# Patient Record
Sex: Female | Born: 1965 | Hispanic: No | Marital: Single | Smoking: Never smoker
Health system: Southern US, Community
[De-identification: ages and names within clinical notes are randomized; demographics above are authoritative.]

## PROBLEM LIST (undated history)

## (undated) DIAGNOSIS — K219 Gastro-esophageal reflux disease without esophagitis: Secondary | ICD-10-CM

---

## 2016-02-13 ENCOUNTER — Emergency Department (HOSPITAL_COMMUNITY): Payer: BLUE CROSS/BLUE SHIELD

## 2016-02-13 ENCOUNTER — Encounter (HOSPITAL_COMMUNITY): Payer: Self-pay | Admitting: Emergency Medicine

## 2016-02-13 ENCOUNTER — Inpatient Hospital Stay (HOSPITAL_COMMUNITY)
Admission: EM | Admit: 2016-02-13 | Discharge: 2016-02-15 | DRG: 343 | Disposition: A | Discharge status: Home / Self Care | Payer: BLUE CROSS/BLUE SHIELD | Attending: General Surgery | Admitting: General Surgery

## 2016-02-13 DIAGNOSIS — K37 Unspecified appendicitis: Secondary | ICD-10-CM | POA: Diagnosis present

## 2016-02-13 DIAGNOSIS — R1031 Right lower quadrant pain: Secondary | ICD-10-CM | POA: Diagnosis present

## 2016-02-13 DIAGNOSIS — K3580 Unspecified acute appendicitis: Secondary | ICD-10-CM | POA: Diagnosis present

## 2016-02-13 LAB — URINALYSIS, ROUTINE W REFLEX MICROSCOPIC
BILIRUBIN URINE: NEGATIVE
GLUCOSE, UA: NEGATIVE mg/dL
HGB URINE DIPSTICK: NEGATIVE
KETONES UR: NEGATIVE mg/dL
Leukocytes, UA: NEGATIVE
NITRITE: NEGATIVE
PH: 6.5 (ref 5.0–8.0)
Protein, ur: NEGATIVE mg/dL
Specific Gravity, Urine: 1.011 (ref 1.005–1.030)

## 2016-02-13 LAB — COMPREHENSIVE METABOLIC PANEL
ALBUMIN: 3.9 g/dL (ref 3.5–5.0)
ALK PHOS: 105 U/L (ref 38–126)
ALT: 19 U/L (ref 14–54)
ANION GAP: 8 (ref 5–15)
AST: 23 U/L (ref 15–41)
BILIRUBIN TOTAL: 0.6 mg/dL (ref 0.3–1.2)
BUN: 12 mg/dL (ref 6–20)
CALCIUM: 9.2 mg/dL (ref 8.9–10.3)
CO2: 22 mmol/L (ref 22–32)
Chloride: 107 mmol/L (ref 101–111)
Creatinine, Ser: 0.82 mg/dL (ref 0.44–1.00)
GFR calc Af Amer: >60 mL/min (ref >60)
GFR calc non Af Amer: >60 mL/min (ref >60)
GLUCOSE: 96 mg/dL (ref 65–99)
Potassium: 3.4 mmol/L — ABNORMAL LOW (ref 3.5–5.1)
Sodium: 137 mmol/L (ref 135–145)
TOTAL PROTEIN: 7.6 g/dL (ref 6.5–8.1)

## 2016-02-13 LAB — CBC WITH DIFFERENTIAL/PLATELET
BASOS PCT: 0 %
Basophils Absolute: 0 10*3/uL (ref 0.0–0.1)
EOS ABS: 0.5 10*3/uL (ref 0.0–0.7)
EOS PCT: 2 %
HCT: 39.4 % (ref 36.0–46.0)
Hemoglobin: 13.3 g/dL (ref 12.0–15.0)
Lymphocytes Relative: 20 %
Lymphs Abs: 4.6 10*3/uL — ABNORMAL HIGH (ref 0.7–4.0)
MCH: 28.6 pg (ref 26.0–34.0)
MCHC: 33.8 g/dL (ref 30.0–36.0)
MCV: 84.7 fL (ref 78.0–100.0)
MONO ABS: 1 10*3/uL (ref 0.1–1.0)
MONOS PCT: 5 %
Neutro Abs: 16.6 10*3/uL — ABNORMAL HIGH (ref 1.7–7.7)
Neutrophils Relative %: 73 %
Platelets: 405 10*3/uL — ABNORMAL HIGH (ref 150–400)
RBC: 4.65 MIL/uL (ref 3.87–5.11)
RDW: 13.7 % (ref 11.5–15.5)
WBC: 22.7 10*3/uL — ABNORMAL HIGH (ref 4.0–10.5)

## 2016-02-13 LAB — POC URINE PREG, ED: Preg Test, Ur: NEGATIVE

## 2016-02-13 MED ORDER — SODIUM CHLORIDE 0.9 % IV SOLN
INTRAVENOUS | Status: DC
Start: 2016-02-13 — End: 2016-02-14

## 2016-02-13 MED ORDER — CEFTRIAXONE SODIUM 2 G IJ SOLR
2.0000 g | Freq: Once | INTRAMUSCULAR | Status: AC
  Administered 2016-02-13: 2 g via INTRAVENOUS
  Filled 2016-02-13: qty 2

## 2016-02-13 MED ORDER — MORPHINE SULFATE (PF) 2 MG/ML IV SOLN
4.0000 mg | Freq: Once | INTRAVENOUS | Status: AC
  Administered 2016-02-13: 4 mg via INTRAVENOUS
  Filled 2016-02-13: qty 2

## 2016-02-13 MED ORDER — METRONIDAZOLE IN NACL 5-0.79 MG/ML-% IV SOLN: 500.0000 mg | Freq: Three times a day (TID) | INTRAVENOUS | Status: DC

## 2016-02-13 MED ORDER — MORPHINE SULFATE (PF) 2 MG/ML IV SOLN: 2.0000 mg | INTRAVENOUS | Status: DC | PRN

## 2016-02-13 MED ORDER — POTASSIUM CHLORIDE 10 MEQ/100ML IV SOLN: 10.0000 meq | Freq: Once | INTRAVENOUS | Status: DC

## 2016-02-13 MED ORDER — SODIUM CHLORIDE 0.9 % IV SOLN
Freq: Once | INTRAVENOUS | Status: AC
  Administered 2016-02-13: 22:00:00 via INTRAVENOUS

## 2016-02-13 MED ORDER — METRONIDAZOLE IN NACL 5-0.79 MG/ML-% IV SOLN
500.0000 mg | Freq: Once | INTRAVENOUS | Status: AC
  Administered 2016-02-13: 500 mg via INTRAVENOUS
  Filled 2016-02-13: qty 100

## 2016-02-13 MED ORDER — IOPAMIDOL (ISOVUE-300) INJECTION 61%
100.0000 mL | Freq: Once | INTRAVENOUS | Status: AC | PRN
  Administered 2016-02-13: 100 mL via INTRAVENOUS

## 2016-02-13 NOTE — ED Notes (Signed)
Pt requested partial dose of pain meds as she has never taken pain medicine before.  RN will re-evaluate pain and administer the rest of the dose depending on Pt wishes and pain level.

## 2016-02-13 NOTE — ED Provider Notes (Signed)
[facility]-EMERGENCY DEPT Provider Note   CSN: [id number] Arrival date & time: 02/13/16  1727     History   Chief Complaint Chief Complaint  Patient presents with  . Abdominal Pain    HPI Monique Rivers is a 50 y.o. female.  HPI Complains of right lower quadrant abdominal pain, nonradiating onset 10 AM today, gradual in onset. Pain is made worse with moving or laughing or changing positions improved with remaining still. She admits to diminished appetite though she ate lunch today. Her last bowel movement was earlier today, normal. Patient is amenorrheic she denies urinary symptoms. No treatment prior to coming here. She was sitting in an urgent care center prior to coming here and sent here for further evaluation. History reviewed. No pertinent past medical history. Past medical history negative There are no active problems to display for this patient.   History reviewed. No pertinent surgical history.  OB History    No data available       Home Medications    Prior to Admission medications   Not on File    Family History No family history on file.  Social History Social History  Substance Use Topics  . Smoking status: Never Smoker  . Smokeless tobacco: Not on file  . Alcohol use No     Allergies   Review of patient's allergies indicates not on file.   Review of Systems Review of Systems  Constitutional: Positive for appetite change.  HENT: Negative.   Respiratory: Negative.   Cardiovascular: Negative.   Gastrointestinal: Positive for abdominal pain.  Musculoskeletal: Negative.   Skin: Negative.   Neurological: Negative.   Psychiatric/Behavioral: Negative.   All other systems reviewed and are negative.    Physical Exam Updated Vital Signs BP 154/67 (BP Location: Left Arm)   Pulse 90   Temp 98.3 F (36.8 C)   Resp 16   SpO2 100%   Physical Exam  Constitutional: She appears well-developed and well-nourished. No distress.  HENT:  Head:  Normocephalic and atraumatic.  Eyes: Conjunctivae are normal. Pupils are equal, round, and reactive to light.  Neck: Neck supple. No tracheal deviation present. No thyromegaly present.  Cardiovascular: Normal rate and regular rhythm.   No murmur heard. Pulmonary/Chest: Effort normal and breath sounds normal.  Abdominal: Soft. Bowel sounds are normal. She exhibits no distension. There is tenderness.  Tender at right lower quadrant  Musculoskeletal: Normal range of motion. She exhibits no edema or tenderness.  Neurological: She is alert. Coordination normal.  Skin: Skin is warm and dry. No rash noted.  Psychiatric: She has a normal mood and affect.  Nursing note and vitals reviewed.   ED Treatments / Results  Labs (all labs ordered are listed, but only abnormal results are displayed) Labs Reviewed  CBC WITH DIFFERENTIAL/PLATELET - Abnormal; Notable for the following:       Result Value   WBC 22.7 (*)    Platelets 405 (*)    Neutro Abs 16.6 (*)    Lymphs Abs 4.6 (*)    All other components within normal limits  URINALYSIS, ROUTINE W REFLEX MICROSCOPIC (NOT AT [facility])  COMPREHENSIVE METABOLIC PANEL  POC URINE PREG, ED    EKG  EKG Interpretation None     Declines pain medicine  Radiology No results found.  Procedures Procedures (including critical care time)  Medications Ordered in ED Medications - No data to display  Results for orders placed or performed during the hospital encounter of 02/13/16  Comprehensive metabolic panel  Result Value Ref Range   Sodium 137 135 - 145 mmol/L   Potassium 3.4 (L) 3.5 - 5.1 mmol/L   Chloride 107 101 - 111 mmol/L   CO2 22 22 - 32 mmol/L   Glucose, Bld 96 65 - 99 mg/dL   BUN 12 6 - 20 mg/dL   Creatinine, Ser 1.61 0.44 - 1.00 mg/dL   Calcium 9.2 8.9 - 09.6 mg/dL   Total Protein 7.6 6.5 - 8.1 g/dL   Albumin 3.9 3.5 - 5.0 g/dL   AST 23 15 - 41 U/L   ALT 19 14 - 54 U/L   Alkaline Phosphatase 105 38 - 126 U/L   Total Bilirubin 0.6  0.3 - 1.2 mg/dL   GFR calc non Af Amer >60 >60 mL/min   GFR calc Af Amer >60 >60 mL/min   Anion gap 8 5 - 15  Urinalysis, Routine w reflex microscopic (not at [facility])  Result Value Ref Range   Color, Urine YELLOW YELLOW   APPearance CLEAR CLEAR   Specific Gravity, Urine 1.011 1.005 - 1.030   pH 6.5 5.0 - 8.0   Glucose, UA NEGATIVE NEGATIVE mg/dL   Hgb urine dipstick NEGATIVE NEGATIVE   Bilirubin Urine NEGATIVE NEGATIVE   Ketones, ur NEGATIVE NEGATIVE mg/dL   Protein, ur NEGATIVE NEGATIVE mg/dL   Nitrite NEGATIVE NEGATIVE   Leukocytes, UA NEGATIVE NEGATIVE  CBC with Differential/Platelet  Result Value Ref Range   WBC 22.7 (H) 4.0 - 10.5 K/uL   RBC 4.65 3.87 - 5.11 MIL/uL   Hemoglobin 13.3 12.0 - 15.0 g/dL   HCT 04.5 40.9 - 81.1 %   MCV 84.7 78.0 - 100.0 fL   MCH 28.6 26.0 - 34.0 pg   MCHC 33.8 30.0 - 36.0 g/dL   RDW 91.4 78.2 - 95.6 %   Platelets 405 (H) 150 - 400 K/uL   Neutrophils Relative % 73 %   Neutro Abs 16.6 (H) 1.7 - 7.7 K/uL   Lymphocytes Relative 20 %   Lymphs Abs 4.6 (H) 0.7 - 4.0 K/uL   Monocytes Relative 5 %   Monocytes Absolute 1.0 0.1 - 1.0 K/uL   Eosinophils Relative 2 %   Eosinophils Absolute 0.5 0.0 - 0.7 K/uL   Basophils Relative 0 %   Basophils Absolute 0.0 0.0 - 0.1 K/uL  POC Urine Preg, ED (not at [facility])  Result Value Ref Range   Preg Test, Ur NEGATIVE NEGATIVE   Ct Abdomen Pelvis W Contrast  Result Date: 02/13/2016 CLINICAL DATA:  Acute onset of right lower quadrant abdominal pain. Initial encounter. EXAM: CT ABDOMEN AND PELVIS WITH CONTRAST TECHNIQUE: Multidetector CT imaging of the abdomen and pelvis was performed using the standard protocol following bolus administration of intravenous contrast. CONTRAST:  ISOVUE-300 IOPAMIDOL (ISOVUE-300) INJECTION 61% COMPARISON:  None. FINDINGS: Lower chest: Minimal right basilar atelectasis is noted. The visualized lung bases are otherwise clear. The visualized portions of the mediastinum are unremarkable.  Hepatobiliary: The liver is unremarkable in appearance. A stone is noted within the gallbladder. The gallbladder is otherwise unremarkable. The common bile duct remains normal in caliber. Pancreas: The pancreas is within normal limits. Spleen: The spleen is unremarkable in appearance. Adrenals/Urinary Tract: The adrenal glands are unremarkable in appearance. The kidneys are within normal limits. There is no evidence of hydronephrosis. No renal or ureteral stones are identified. No perinephric stranding is seen. Stomach/Bowel: There is dilatation of the appendix to 1.2 cm in maximal diameter, with mild surrounding soft tissue inflammation and trace  fluid. Appendicoliths are noted within the appendix, measuring up to 0.9 cm in size. There is no evidence of perforation or abscess formation at this time. Contrast progresses to the level of the splenic flexure of the colon. The colon is grossly unremarkable in appearance. Visualized small bowel loops are grossly unremarkable. The stomach is unremarkable in appearance. Vascular/Lymphatic: The abdominal aorta is unremarkable in appearance. The inferior vena cava is grossly unremarkable. No retroperitoneal lymphadenopathy is seen. No pelvic sidewall lymphadenopathy is identified. Reproductive: The bladder is mildly distended and within normal limits. The uterus is grossly unremarkable in appearance. The ovaries are relatively symmetric. No suspicious adnexal masses are seen. Other: No additional soft tissue abnormalities are seen. Musculoskeletal: No acute osseous abnormalities are identified. The visualized musculature is unremarkable in appearance. IMPRESSION: 1. Acute appendicitis, with dilatation of the appendix to 1.2 cm in maximal diameter. Mild surrounding soft tissue inflammation and trace fluid. Appendicoliths noted within the appendix, measuring up to 0.9 cm in size. 2. Cholelithiasis.  Gallbladder otherwise unremarkable. These results were called by telephone at  the time of interpretation on 02/13/2016 at 9:54 pm to Dr. Doug SouSAM Kailiana Granquist, who verbally acknowledged these results. Electronically Signed   By: Roanna RaiderJeffery  Chang M.D.   On: 02/13/2016 21:57   Initial Impression / Assessment and Plan / ED Course  I have reviewed the triage vital signs and the nursing notes.  Pertinent labs & imaging results that were available during my care of the patient were reviewed by me and considered in my medical decision making (see chart for details).  Clinical Course   10:10 PM requesting more pain medicine after initial treatment with intravenous morphine. Additional IV morphine ordered Intravenous antibiotics ordered. Surgery service Dr.Toth consulted. He will see patient in the hospital. Patient will be admitted to medical surgical floor   Final Clinical Impressions(s) / ED Diagnoses  Diagnosis #1acute appendicitis #2 hypokalemia Final diagnoses:  None    New Prescriptions New Prescriptions   No medications on file     Doug SouSam Homer Miller, MD 02/13/16 2219

## 2016-02-13 NOTE — ED Triage Notes (Signed)
Per pt, states right lower abdominal pain since 10 am-went to [facility] and was told to come here

## 2016-02-13 NOTE — ED Notes (Signed)
ED Provider at bedside. 

## 2016-02-13 NOTE — H&P (Signed)
Monique Rivers is an 50 y.o. female.   Chief Complaint: abdominal pain HPI:  The patient is a 50 year old white female who began having acute right lower quadrant pain about 10 AM this morning. She has had some nausea associated with it but no vomiting. The pain persisted as the day went on so she went to the emergency department. A CT scan showed an inflamed appendix but no sign of perforation.  History reviewed. No pertinent past medical history.  History reviewed. No pertinent surgical history.  No family history on file. Social History:  reports that she has never smoked. She does not have any smokeless tobacco history on file. She reports that she does not drink alcohol. Her drug history is not on file.  Allergies:  Allergies  Allergen Reactions  . Amoxicillin Nausea And Vomiting    Has patient had a PCN reaction causing immediate rash, facial/tongue/throat swelling, SOB or lightheadedness with hypotension: No Has patient had a PCN reaction causing severe rash involving mucus membranes or skin necrosis: No Has patient had a PCN reaction that required hospitalization: No Has patient had a PCN reaction occurring within the last 10 years: No If all of the above answers are "NO", then may proceed with Cephalosporin use.      (Not in a hospital admission)  Results for orders placed or performed during the hospital encounter of 02/13/16 (from the past 48 hour(s))  Urinalysis, Routine w reflex microscopic (not at [facility])     Status: None   Collection Time: 02/13/16  7:55 PM  Result Value Ref Range   Color, Urine YELLOW YELLOW   APPearance CLEAR CLEAR   Specific Gravity, Urine 1.011 1.005 - 1.030   pH 6.5 5.0 - 8.0   Glucose, UA NEGATIVE NEGATIVE mg/dL   Hgb urine dipstick NEGATIVE NEGATIVE   Bilirubin Urine NEGATIVE NEGATIVE   Ketones, ur NEGATIVE NEGATIVE mg/dL   Protein, ur NEGATIVE NEGATIVE mg/dL   Nitrite NEGATIVE NEGATIVE   Leukocytes, UA NEGATIVE NEGATIVE    Comment:  MICROSCOPIC NOT DONE ON URINES WITH NEGATIVE PROTEIN, BLOOD, LEUKOCYTES, NITRITE, OR GLUCOSE <1000 mg/dL.  Comprehensive metabolic panel     Status: Abnormal   Collection Time: 02/13/16  8:00 PM  Result Value Ref Range   Sodium 137 135 - 145 mmol/L   Potassium 3.4 (L) 3.5 - 5.1 mmol/L   Chloride 107 101 - 111 mmol/L   CO2 22 22 - 32 mmol/L   Glucose, Bld 96 65 - 99 mg/dL   BUN 12 6 - 20 mg/dL   Creatinine, Ser 0.82 0.44 - 1.00 mg/dL   Calcium 9.2 8.9 - 10.3 mg/dL   Total Protein 7.6 6.5 - 8.1 g/dL   Albumin 3.9 3.5 - 5.0 g/dL   AST 23 15 - 41 U/L   ALT 19 14 - 54 U/L   Alkaline Phosphatase 105 38 - 126 U/L   Total Bilirubin 0.6 0.3 - 1.2 mg/dL   GFR calc non Af Amer >60 >60 mL/min   GFR calc Af Amer >60 >60 mL/min    Comment: (NOTE) The eGFR has been calculated using the CKD EPI equation. This calculation has not been validated in all clinical situations. eGFR's persistently <60 mL/min signify possible Chronic Kidney Disease.    Anion gap 8 5 - 15  CBC with Differential/Platelet     Status: Abnormal   Collection Time: 02/13/16  8:00 PM  Result Value Ref Range   WBC 22.7 (H) 4.0 - 10.5 K/uL   RBC  4.65 3.87 - 5.11 MIL/uL   Hemoglobin 13.3 12.0 - 15.0 g/dL   HCT 39.4 36.0 - 46.0 %   MCV 84.7 78.0 - 100.0 fL   MCH 28.6 26.0 - 34.0 pg   MCHC 33.8 30.0 - 36.0 g/dL   RDW 13.7 11.5 - 15.5 %   Platelets 405 (H) 150 - 400 K/uL   Neutrophils Relative % 73 %   Neutro Abs 16.6 (H) 1.7 - 7.7 K/uL   Lymphocytes Relative 20 %   Lymphs Abs 4.6 (H) 0.7 - 4.0 K/uL   Monocytes Relative 5 %   Monocytes Absolute 1.0 0.1 - 1.0 K/uL   Eosinophils Relative 2 %   Eosinophils Absolute 0.5 0.0 - 0.7 K/uL   Basophils Relative 0 %   Basophils Absolute 0.0 0.0 - 0.1 K/uL  POC Urine Preg, ED (not at [facility])     Status: None   Collection Time: 02/13/16  8:04 PM  Result Value Ref Range   Preg Test, Ur NEGATIVE NEGATIVE    Comment:        THE SENSITIVITY OF THIS METHODOLOGY IS >24 mIU/mL    Ct  Abdomen Pelvis W Contrast  Result Date: 02/13/2016 CLINICAL DATA:  Acute onset of right lower quadrant abdominal pain. Initial encounter. EXAM: CT ABDOMEN AND PELVIS WITH CONTRAST TECHNIQUE: Multidetector CT imaging of the abdomen and pelvis was performed using the standard protocol following bolus administration of intravenous contrast. CONTRAST:  146m ISOVUE-300 IOPAMIDOL (ISOVUE-300) INJECTION 61% COMPARISON:  None. FINDINGS: Lower chest: Minimal right basilar atelectasis is noted. The visualized lung bases are otherwise clear. The visualized portions of the mediastinum are unremarkable. Hepatobiliary: The liver is unremarkable in appearance. A stone is noted within the gallbladder. The gallbladder is otherwise unremarkable. The common bile duct remains normal in caliber. Pancreas: The pancreas is within normal limits. Spleen: The spleen is unremarkable in appearance. Adrenals/Urinary Tract: The adrenal glands are unremarkable in appearance. The kidneys are within normal limits. There is no evidence of hydronephrosis. No renal or ureteral stones are identified. No perinephric stranding is seen. Stomach/Bowel: There is dilatation of the appendix to 1.2 cm in maximal diameter, with mild surrounding soft tissue inflammation and trace fluid. Appendicoliths are noted within the appendix, measuring up to 0.9 cm in size. There is no evidence of perforation or abscess formation at this time. Contrast progresses to the level of the splenic flexure of the colon. The colon is grossly unremarkable in appearance. Visualized small bowel loops are grossly unremarkable. The stomach is unremarkable in appearance. Vascular/Lymphatic: The abdominal aorta is unremarkable in appearance. The inferior vena cava is grossly unremarkable. No retroperitoneal lymphadenopathy is seen. No pelvic sidewall lymphadenopathy is identified. Reproductive: The bladder is mildly distended and within normal limits. The uterus is grossly unremarkable  in appearance. The ovaries are relatively symmetric. No suspicious adnexal masses are seen. Other: No additional soft tissue abnormalities are seen. Musculoskeletal: No acute osseous abnormalities are identified. The visualized musculature is unremarkable in appearance. IMPRESSION: 1. Acute appendicitis, with dilatation of the appendix to 1.2 cm in maximal diameter. Mild surrounding soft tissue inflammation and trace fluid. Appendicoliths noted within the appendix, measuring up to 0.9 cm in size. 2. Cholelithiasis.  Gallbladder otherwise unremarkable. These results were called by telephone at the time of interpretation on 02/13/2016 at 9:54 pm to Dr. SOrlie Dakin who verbally acknowledged these results. Electronically Signed   By: JGarald BaldingM.D.   On: 02/13/2016 21:57    Review of Systems  Constitutional:  Negative.   HENT: Negative.   Eyes: Negative.   Respiratory: Negative.   Cardiovascular: Negative.   Gastrointestinal: Positive for abdominal pain and nausea. Negative for vomiting.  Genitourinary: Negative.   Musculoskeletal: Negative.   Skin: Negative.   Neurological: Negative.   Endo/Heme/Allergies: Negative.   Psychiatric/Behavioral: Negative.     Blood pressure 153/72, pulse 94, temperature 98.3 F (36.8 C), resp. rate 20, SpO2 99 %. Physical Exam  Constitutional: She is oriented to person, place, and time. She appears well-developed and well-nourished.  HENT:  Head: Normocephalic and atraumatic.  Eyes: Conjunctivae and EOM are normal. Pupils are equal, round, and reactive to light.  Neck: Normal range of motion. Neck supple.  Cardiovascular: Normal rate, regular rhythm and normal heart sounds.   Respiratory: Effort normal and breath sounds normal.  GI: Soft.  There is moderate focal tenderness in the right lower quadrant  Musculoskeletal: Normal range of motion.  Neurological: She is alert and oriented to person, place, and time.  Skin: Skin is warm and dry.   Psychiatric: She has a normal mood and affect. Her behavior is normal.     Assessment/Plan  The patient appears to have acute appendicitis. Because of the risk of perforation and sepsis likely she would benefit from having her appendix removed. I will plan to admit her and start her on broad-spectrum antibiotics. I will discuss her care with the team in the morning and plan for surgery in the morning.  Merrie Roof, MD 02/13/2016, 11:18 PM

## 2016-02-13 NOTE — Progress Notes (Signed)
Patient listed as having [employer] insurance without a pcp.  EDCM spoke to patient at bedside.  Patient reports her pcp is [facility]ilford [facility]ynne [facility].  System updated.

## 2016-02-14 ENCOUNTER — Encounter (HOSPITAL_COMMUNITY): Admission: EM | Disposition: A | Discharge status: Home / Self Care | Payer: Self-pay | Source: Home / Self Care

## 2016-02-14 ENCOUNTER — Inpatient Hospital Stay (HOSPITAL_COMMUNITY): Payer: BLUE CROSS/BLUE SHIELD | Admitting: Certified Registered Nurse Anesthetist

## 2016-02-14 ENCOUNTER — Encounter (HOSPITAL_COMMUNITY): Payer: Self-pay | Admitting: General Surgery

## 2016-02-14 DIAGNOSIS — K37 Unspecified appendicitis: Secondary | ICD-10-CM | POA: Diagnosis present

## 2016-02-14 HISTORY — PX: LAPAROSCOPIC APPENDECTOMY: SHX408

## 2016-02-14 LAB — SURGICAL PCR SCREEN
MRSA, PCR: NEGATIVE
Staphylococcus aureus: POSITIVE — AB

## 2016-02-14 SURGERY — APPENDECTOMY, LAPAROSCOPIC
Anesthesia: General | Site: Abdomen

## 2016-02-14 MED ORDER — MORPHINE SULFATE (PF) 10 MG/ML IV SOLN: 2.0000 mg | INTRAVENOUS | Status: DC | PRN

## 2016-02-14 MED ORDER — GLYCOPYRROLATE 0.2 MG/ML IV SOSY
PREFILLED_SYRINGE | INTRAVENOUS | Status: DC | PRN
  Administered 2016-02-14: 0.8 mg via INTRAVENOUS

## 2016-02-14 MED ORDER — LIDOCAINE HCL (CARDIAC) 20 MG/ML IV SOLN
INTRAVENOUS | Status: DC | PRN
  Administered 2016-02-14: 100 mg via INTRAVENOUS

## 2016-02-14 MED ORDER — DEXAMETHASONE SODIUM PHOSPHATE 10 MG/ML IJ SOLN
INTRAMUSCULAR | Status: DC | PRN
  Administered 2016-02-14: 10 mg via INTRAVENOUS

## 2016-02-14 MED ORDER — ONDANSETRON HCL 4 MG/2ML IJ SOLN
4.0000 mg | Freq: Four times a day (QID) | INTRAMUSCULAR | Status: DC | PRN
  Administered 2016-02-14: 4 mg via INTRAVENOUS
  Filled 2016-02-14: qty 2

## 2016-02-14 MED ORDER — HYDROMORPHONE HCL 1 MG/ML IJ SOLN: 0.2500 mg | INTRAMUSCULAR | Status: DC | PRN

## 2016-02-14 MED ORDER — SCOPOLAMINE 1 MG/3DAYS TD PT72
MEDICATED_PATCH | TRANSDERMAL | Status: DC | PRN
  Administered 2016-02-14: 1 mg via TRANSDERMAL

## 2016-02-14 MED ORDER — LACTATED RINGERS IR SOLN
Status: DC | PRN
  Administered 2016-02-14: 1000 mL

## 2016-02-14 MED ORDER — DIPHENHYDRAMINE HCL 25 MG PO CAPS
25.0000 mg | ORAL_CAPSULE | Freq: Every evening | ORAL | Status: DC | PRN
  Administered 2016-02-14: 25 mg via ORAL
  Filled 2016-02-14: qty 1

## 2016-02-14 MED ORDER — GLYCOPYRROLATE 0.2 MG/ML IV SOSY
PREFILLED_SYRINGE | INTRAVENOUS | Status: AC
  Filled 2016-02-14: qty 9

## 2016-02-14 MED ORDER — ONDANSETRON HCL 4 MG/2ML IJ SOLN
INTRAMUSCULAR | Status: AC
  Filled 2016-02-14: qty 2

## 2016-02-14 MED ORDER — CHLORHEXIDINE GLUCONATE CLOTH 2 % EX PADS: 6.0000 | MEDICATED_PAD | Freq: Every day | CUTANEOUS | Status: DC

## 2016-02-14 MED ORDER — LIDOCAINE 2% (20 MG/ML) 5 ML SYRINGE
INTRAMUSCULAR | Status: AC
  Filled 2016-02-14: qty 5

## 2016-02-14 MED ORDER — SUCCINYLCHOLINE CHLORIDE 20 MG/ML IJ SOLN
INTRAMUSCULAR | Status: DC | PRN
  Administered 2016-02-14: 100 mg via INTRAVENOUS

## 2016-02-14 MED ORDER — HYDROCODONE-ACETAMINOPHEN 5-325 MG PO TABS
1.0000 | ORAL_TABLET | ORAL | Status: DC | PRN
  Administered 2016-02-15: 1 via ORAL
  Filled 2016-02-14: qty 1

## 2016-02-14 MED ORDER — CIPROFLOXACIN IN D5W 400 MG/200ML IV SOLN
400.0000 mg | Freq: Two times a day (BID) | INTRAVENOUS | Status: DC
  Administered 2016-02-14 – 2016-02-15 (×3): 400 mg via INTRAVENOUS
  Filled 2016-02-14 (×3): qty 200

## 2016-02-14 MED ORDER — KCL IN DEXTROSE-NACL 20-5-0.9 MEQ/L-%-% IV SOLN
INTRAVENOUS | Status: DC
  Administered 2016-02-14 (×2): via INTRAVENOUS
  Administered 2016-02-15: 100 mL/h via INTRAVENOUS
  Filled 2016-02-14 (×5): qty 1000

## 2016-02-14 MED ORDER — DIPHENHYDRAMINE HCL 50 MG/ML IJ SOLN
25.0000 mg | Freq: Four times a day (QID) | INTRAMUSCULAR | Status: DC | PRN
  Administered 2016-02-14: 25 mg via INTRAVENOUS
  Filled 2016-02-14: qty 1

## 2016-02-14 MED ORDER — MORPHINE SULFATE (PF) 2 MG/ML IV SOLN
1.0000 mg | INTRAVENOUS | Status: DC | PRN
  Administered 2016-02-14 (×2): 2 mg via INTRAVENOUS
  Filled 2016-02-14 (×2): qty 1

## 2016-02-14 MED ORDER — METRONIDAZOLE IN NACL 5-0.79 MG/ML-% IV SOLN
500.0000 mg | Freq: Three times a day (TID) | INTRAVENOUS | Status: DC
  Administered 2016-02-14 – 2016-02-15 (×5): 500 mg via INTRAVENOUS
  Filled 2016-02-14 (×5): qty 100

## 2016-02-14 MED ORDER — FAMOTIDINE IN NACL 20-0.9 MG/50ML-% IV SOLN
20.0000 mg | Freq: Two times a day (BID) | INTRAVENOUS | Status: DC
  Administered 2016-02-14 (×2): 20 mg via INTRAVENOUS
  Filled 2016-02-14 (×3): qty 50

## 2016-02-14 MED ORDER — MUPIROCIN 2 % EX OINT
1.0000 "application " | TOPICAL_OINTMENT | Freq: Two times a day (BID) | CUTANEOUS | Status: DC
  Administered 2016-02-14 (×2): 1 via NASAL
  Filled 2016-02-14 (×2): qty 22

## 2016-02-14 MED ORDER — FENTANYL CITRATE (PF) 100 MCG/2ML IJ SOLN
INTRAMUSCULAR | Status: AC
Start: 2016-02-14 — End: 2016-02-14
  Filled 2016-02-14: qty 4

## 2016-02-14 MED ORDER — NEOSTIGMINE METHYLSULFATE 5 MG/5ML IV SOSY
PREFILLED_SYRINGE | INTRAVENOUS | Status: DC | PRN
  Administered 2016-02-14: 5 mg via INTRAVENOUS

## 2016-02-14 MED ORDER — BUPIVACAINE HCL (PF) 0.5 % IJ SOLN
INTRAMUSCULAR | Status: AC
  Filled 2016-02-14: qty 30

## 2016-02-14 MED ORDER — BUPIVACAINE HCL (PF) 0.5 % IJ SOLN
INTRAMUSCULAR | Status: DC | PRN
  Administered 2016-02-14: 10 mL

## 2016-02-14 MED ORDER — FENTANYL CITRATE (PF) 100 MCG/2ML IJ SOLN
INTRAMUSCULAR | Status: AC
  Filled 2016-02-14: qty 2

## 2016-02-14 MED ORDER — MEPERIDINE HCL 50 MG/ML IJ SOLN: 6.2500 mg | INTRAMUSCULAR | Status: DC | PRN

## 2016-02-14 MED ORDER — MIDAZOLAM HCL 2 MG/2ML IJ SOLN
INTRAMUSCULAR | Status: AC
  Filled 2016-02-14: qty 2

## 2016-02-14 MED ORDER — 0.9 % SODIUM CHLORIDE (POUR BTL) OPTIME
TOPICAL | Status: DC | PRN
  Administered 2016-02-14: 1000 mL

## 2016-02-14 MED ORDER — SCOPOLAMINE 1 MG/3DAYS TD PT72
MEDICATED_PATCH | TRANSDERMAL | Status: AC
  Filled 2016-02-14: qty 1

## 2016-02-14 MED ORDER — ROCURONIUM BROMIDE 100 MG/10ML IV SOLN
INTRAVENOUS | Status: DC | PRN
  Administered 2016-02-14: 40 mg via INTRAVENOUS

## 2016-02-14 MED ORDER — ONDANSETRON HCL 4 MG/2ML IJ SOLN
INTRAMUSCULAR | Status: DC | PRN
  Administered 2016-02-14: 4 mg via INTRAVENOUS

## 2016-02-14 MED ORDER — MIDAZOLAM HCL 5 MG/5ML IJ SOLN
INTRAMUSCULAR | Status: DC | PRN
  Administered 2016-02-14: 2 mg via INTRAVENOUS

## 2016-02-14 MED ORDER — ONDANSETRON 4 MG PO TBDP: 4.0000 mg | ORAL_TABLET | Freq: Four times a day (QID) | ORAL | Status: DC | PRN

## 2016-02-14 MED ORDER — PROMETHAZINE HCL 25 MG/ML IJ SOLN: 6.2500 mg | INTRAMUSCULAR | Status: DC | PRN

## 2016-02-14 MED ORDER — ACETAMINOPHEN 325 MG PO TABS
650.0000 mg | ORAL_TABLET | Freq: Four times a day (QID) | ORAL | Status: DC | PRN
  Administered 2016-02-14: 650 mg via ORAL
  Filled 2016-02-14: qty 2

## 2016-02-14 MED ORDER — FENTANYL CITRATE (PF) 100 MCG/2ML IJ SOLN
INTRAMUSCULAR | Status: DC | PRN
  Administered 2016-02-14 (×3): 50 ug via INTRAVENOUS
  Administered 2016-02-14: 100 ug via INTRAVENOUS

## 2016-02-14 MED ORDER — LACTATED RINGERS IV SOLN
INTRAVENOUS | Status: DC
  Administered 2016-02-14 (×3): via INTRAVENOUS

## 2016-02-14 MED ORDER — DEXAMETHASONE SODIUM PHOSPHATE 10 MG/ML IJ SOLN
INTRAMUSCULAR | Status: AC
  Filled 2016-02-14: qty 1

## 2016-02-14 MED ORDER — PROPOFOL 10 MG/ML IV BOLUS
INTRAVENOUS | Status: AC
  Filled 2016-02-14: qty 20

## 2016-02-14 MED ORDER — PROPOFOL 10 MG/ML IV BOLUS
INTRAVENOUS | Status: DC | PRN
  Administered 2016-02-14: 150 mg via INTRAVENOUS

## 2016-02-14 SURGICAL SUPPLY — 52 items
APL SKNCLS STERI-STRIP NONHPOA (GAUZE/BANDAGES/DRESSINGS) ×1
APPLIER CLIP 5 13 M/L LIGAMAX5 (MISCELLANEOUS)
APPLIER CLIP ROT 10 11.4 M/L (STAPLE) ×3
APR CLP MED LRG 11.4X10 (STAPLE) ×1
APR CLP MED LRG 5 ANG JAW (MISCELLANEOUS)
BAG SPEC RTRVL LRG 6X4 10 (ENDOMECHANICALS) ×1
BENZOIN TINCTURE PRP APPL 2/3 (GAUZE/BANDAGES/DRESSINGS) ×3 IMPLANT
CABLE HIGH FREQUENCY MONO STRZ (ELECTRODE) ×3 IMPLANT
CHLORAPREP W/TINT 26ML (MISCELLANEOUS) ×3 IMPLANT
CLIP APPLIE 5 13 M/L LIGAMAX5 (MISCELLANEOUS) IMPLANT
CLIP APPLIE ROT 10 11.4 M/L (STAPLE) IMPLANT
CLOSURE WOUND 1/2 X4 (GAUZE/BANDAGES/DRESSINGS) ×1
COVER SURGICAL LIGHT HANDLE (MISCELLANEOUS) ×3 IMPLANT
CUTTER FLEX LINEAR 45M (STAPLE) IMPLANT
DECANTER SPIKE VIAL GLASS SM (MISCELLANEOUS) ×3 IMPLANT
DRAIN CHANNEL 19F RND (DRAIN) IMPLANT
DRAPE LAPAROSCOPIC ABDOMINAL (DRAPES) ×3 IMPLANT
DRSG TEGADERM 2-3/8X2-3/4 SM (GAUZE/BANDAGES/DRESSINGS) ×5 IMPLANT
ELECT REM PT RETURN 9FT ADLT (ELECTROSURGICAL) ×3
ELECTRODE REM PT RTRN 9FT ADLT (ELECTROSURGICAL) ×1 IMPLANT
ENDOLOOP SUT PDS II  0 18 (SUTURE)
ENDOLOOP SUT PDS II 0 18 (SUTURE) IMPLANT
EVACUATOR SILICONE 100CC (DRAIN) IMPLANT
GAUZE SPONGE 2X2 8PLY STRL LF (GAUZE/BANDAGES/DRESSINGS) ×1 IMPLANT
GLOVE BIOGEL PI IND STRL 6.5 (GLOVE) IMPLANT
GLOVE BIOGEL PI INDICATOR 6.5 (GLOVE) ×8
GLOVE ECLIPSE 8.0 STRL XLNG CF (GLOVE) ×3 IMPLANT
GLOVE INDICATOR 8.0 STRL GRN (GLOVE) ×3 IMPLANT
GOWN STRL REUS W/TWL XL LVL3 (GOWN DISPOSABLE) ×8 IMPLANT
IRRIG SUCT STRYKERFLOW 2 WTIP (MISCELLANEOUS) ×3
IRRIGATION SUCT STRKRFLW 2 WTP (MISCELLANEOUS) ×1 IMPLANT
KIT BASIN OR (CUSTOM PROCEDURE TRAY) ×3 IMPLANT
POUCH SPECIMEN RETRIEVAL 10MM (ENDOMECHANICALS) ×3 IMPLANT
RELOAD 45 VASCULAR/THIN (ENDOMECHANICALS) IMPLANT
RELOAD STAPLE 45 2.5 WHT GRN (ENDOMECHANICALS) IMPLANT
RELOAD STAPLE 45 3.5 BLU ETS (ENDOMECHANICALS) IMPLANT
RELOAD STAPLE TA45 3.5 REG BLU (ENDOMECHANICALS) ×3 IMPLANT
SCISSORS LAP 5X35 DISP (ENDOMECHANICALS) IMPLANT
SHEARS HARMONIC ACE PLUS 36CM (ENDOMECHANICALS) ×3 IMPLANT
SLEEVE XCEL OPT CAN 5 100 (ENDOMECHANICALS) ×3 IMPLANT
SPONGE GAUZE 2X2 STER 10/PKG (GAUZE/BANDAGES/DRESSINGS) ×2
STRIP CLOSURE SKIN 1/2X4 (GAUZE/BANDAGES/DRESSINGS) ×2 IMPLANT
SUT ETHILON 3 0 PS 1 (SUTURE) IMPLANT
SUT MNCRL AB 4-0 PS2 18 (SUTURE) ×3 IMPLANT
TOWEL OR 17X26 10 PK STRL BLUE (TOWEL DISPOSABLE) ×3 IMPLANT
TOWEL OR NON WOVEN STRL DISP B (DISPOSABLE) ×3 IMPLANT
TRAY FOLEY W/METER SILVER 14FR (SET/KITS/TRAYS/PACK) IMPLANT
TRAY FOLEY W/METER SILVER 16FR (SET/KITS/TRAYS/PACK) IMPLANT
TRAY LAPAROSCOPIC (CUSTOM PROCEDURE TRAY) ×3 IMPLANT
TROCAR BLADELESS OPT 5 100 (ENDOMECHANICALS) ×3 IMPLANT
TROCAR XCEL BLUNT TIP 100MML (ENDOMECHANICALS) ×3 IMPLANT
TUBING INSUF HEATED (TUBING) ×3 IMPLANT

## 2016-02-14 NOTE — Progress Notes (Signed)
Monique Rivers is a 50 y.o. female patient admitted from ED awake, alert - oriented  X 4 - no acute distress noted.  VSS - Blood pressure 136/74, pulse 91, temperature 98.6 F (37 C), temperature source Axillary, resp. rate 20, SpO2 99 %.    IV in place, occlusive dsg intact without redness.  Orientation to room, and floor completed with information packet given to patient/family.  Patient declined safety video at this time.  Admission INP armband ID verified with patient/family, and in place.   SR up x 2, fall assessment complete, with patient and family able to verbalize understanding of risk associated with falls, and verbalized understanding to call nsg before up out of bed.  Call light within reach, patient able to voice, and demonstrate understanding.  Skin, clean-dry- intact without evidence of bruising, or skin tears.   No evidence of skin break down noted on exam.     Will cont to eval and treat per MD orders.  Elissa HeftyPatricia D Ramirez, RN 02/14/2016 12:39 AM

## 2016-02-14 NOTE — Progress Notes (Signed)
She was seen and examined. CT reviewed. Plan laparoscopic possible open appendectomy.I have discussed the procedure and risks of appendectomy. The risks include but are not limited to bleeding, infection, wound problems, anesthesia, injury to intra-abdominal organs, possibility of postoperative ileus. She seems to understand and agrees with the plan.

## 2016-02-14 NOTE — Anesthesia Postprocedure Evaluation (Signed)
Anesthesia Post Note  Patient: Monique Rivers  Procedure(s) Performed: Procedure(s) (LRB): APPENDECTOMY LAPAROSCOPIC (N/A)  Patient location during evaluation: PACU Anesthesia Type: General Level of consciousness: sedated and patient cooperative Pain management: pain level controlled Vital Signs Assessment: post-procedure vital signs reviewed and stable Respiratory status: spontaneous breathing Cardiovascular status: stable Anesthetic complications: no    Last Vitals:  Vitals:   02/14/16 1234 02/14/16 1349  BP: 115/62 120/60  Pulse:  77  Resp: 12 15  Temp: 36.6 C 36.7 C    Last Pain:  Vitals:   02/14/16 1349  TempSrc: Oral  PainSc:                  Monique Rivers

## 2016-02-14 NOTE — Progress Notes (Signed)
Patient with positional IV, requested another IV in left arm. 2 unsuccessful attempts made, patient declined IV team consult.

## 2016-02-14 NOTE — Op Note (Signed)
Appendectomy, Lap, Procedure Note  Pre-operative Diagnosis:  Acute appendicitis  Post-operative Diagnosis: Same  Procedure:  Laparoscopic appendectomy  Surgeon:  Avel Peaceodd Opal Dinning, M.D.  Anesthesia:  General   Indications:  This is a 50 year old female admitted earlier this morning with acute appendicitis.  She is brought to the OR for appendectomy.   Procedure Details   She was brought to the operating room, placed in the supine position and general anesthesia was induced, along with placement of orogastric tube, SCDs, and a Foley catheter. A timeout was performed. The abdomen was prepped and draped in a sterile fashion. A small infraumbilical incision was made through the skin, subcutaneous tissue, fascia, and peritoneum entering the peritoneal cavity under direct vision.  A pursestring suture was passed around the fascia with a 0 Vicryl.  The Hasson was introduced into the peritoneal cavity and the tails of the suture were used to hold the Hasson in place.   The pneumoperitoneum was then established to steady pressure of 15 mmHg.   The laparoscope was introduced and there was no evidence of bleeding or underlying organ injury. Additional 5 mm cannulas then placed in the left lower quadrant of the abdomen and the right upper quadrant region under direct visualization. A careful evaluation of the entire abdomen was carried out. The patient was placed in Trendelenburg and left lateral decubitus position. The small intestines were retracted in the cephalad and left lateral direction away from the pelvis and right lower quadrant. The patient was found to have an enlarged and inflamed appendix that was extending into the right pelvis. There was no evidence of perforation.  The appendix was carefully mobilized. The mesoappendix was was divided with the harmonic scalpel.   The appendix was amputated off the cecum, with a small cuff of cecum, using an endo-GIA stapler.  The appendix was placed in a  retrieval bag and removed through the subumbilical port incision.    There was no evidence of bleeding, leakage, or complication after division of the appendix. Copious irrigation was  performed and irrigant fluid suctioned from the abdomen as much as possible.  The umbilical trocar was removed and the  port site fascia was closed via the purse string suture under laparoscopic vision. There was no residual palpable fascial defect.  The remaining trocars were removed and all  trocar site skin wounds were closed with 4-0 Monocryl.  Instrument, sponge, and needle counts were correct at the conclusion of the case.   Findings: The appendix was found to be inflamed. There were not signs of necrosis.  There was not perforation. There was not abscess formation.  Estimated Blood Loss:  150 ml         Drains: none         Specimens: appendix         Complications:  None; patient tolerated the procedure well.         Disposition: PACU - hemodynamically stable.         Condition: stable

## 2016-02-14 NOTE — Anesthesia Preprocedure Evaluation (Signed)
Anesthesia Evaluation  Patient identified by MRN, date of birth, ID band Patient awake    Reviewed: Allergy & Precautions, NPO status , Patient's Chart, lab work & pertinent test results  Airway Mallampati: II  TM Distance: >3 FB Neck ROM: Full    Dental no notable dental hx.    Pulmonary neg pulmonary ROS,    Pulmonary exam normal breath sounds clear to auscultation       Cardiovascular negative cardio ROS Normal cardiovascular exam Rhythm:Regular Rate:Normal     Neuro/Psych negative neurological ROS  negative psych ROS   GI/Hepatic negative GI ROS, Neg liver ROS,   Endo/Other  negative endocrine ROS  Renal/GU negative Renal ROS  negative genitourinary   Musculoskeletal negative musculoskeletal ROS (+)   Abdominal   Peds negative pediatric ROS (+)  Hematology negative hematology ROS (+)   Anesthesia Other Findings   Reproductive/Obstetrics negative OB ROS                             Anesthesia Physical Anesthesia Plan  ASA: II  Anesthesia Plan: General   Post-op Pain Management:    Induction: Intravenous  Airway Management Planned: Oral ETT  Additional Equipment:   Intra-op Plan:   Post-operative Plan: Extubation in OR  Informed Consent: I have reviewed the patients History and Physical, chart, labs and discussed the procedure including the risks, benefits and alternatives for the proposed anesthesia with the patient or authorized representative who has indicated his/her understanding and acceptance.   Dental advisory given  Plan Discussed with: CRNA  Anesthesia Plan Comments:         Anesthesia Quick Evaluation  

## 2016-02-14 NOTE — Progress Notes (Signed)
Paged Dr. Carolynne Edouardoth about patient BP 163/85. Other VSS. Patient stable.  Received a call from MD, no new orders at this time/ will monitor patient.

## 2016-02-14 NOTE — Transfer of Care (Signed)
Immediate Anesthesia Transfer of Care Note  Patient: Monique Rivers  Procedure(s) Performed: Procedure(s): APPENDECTOMY LAPAROSCOPIC (N/A)  Patient Location: PACU  Anesthesia Type:General  Level of Consciousness:  sedated, patient cooperative and responds to stimulation  Airway & Oxygen Therapy:Patient Spontanous Breathing and Patient connected to face mask oxgen  Post-op Assessment:  Report given to PACU RN and Post -op Vital signs reviewed and stable  Post vital signs:  Reviewed and stable  Last Vitals:  Vitals:   02/14/16 0609 02/14/16 1156  BP: (!) 163/85 (!) 130/113  Pulse: 89 (!) 104  Resp: 20 (P) 16  Temp: 36.8 C (P) 36.8 C    Complications: No apparent anesthesia complications

## 2016-02-14 NOTE — Discharge Instructions (Addendum)
[facility] ______[facility] LAPAROSCOPIC SURGERY: POST OP INSTRUCTIONS Always review your discharge instruction sheet given to you by the facility where your surgery was performed. IF YOU HAVE DISABILITY OR FAMILY LEAVE FORMS, YOU MUST BRING THEM TO THE OFFICE FOR PROCESSING.   DO NOT GIVE THEM TO YOUR DOCTOR.  1. A prescription for pain medication may be given to you upon discharge.  Take your pain medication as prescribed, if needed.  If narcotic pain medicine is not needed, then you may take acetaminophen (Tylenol) or ibuprofen (Advil) as needed. 2. Take your usually prescribed medications unless otherwise directed. 3. If you need a refill on your pain medication, please contact your pharmacy.  They will contact our office to request authorization. Prescriptions will not be filled after 5pm or on week-ends. 4. You should follow a light diet the first few days after arrival home, such as soup and crackers, etc.  Be sure to include lots of fluids daily.  May start lowfat, solid foods 2 days after the surgery. 5. Most patients will experience some swelling and bruising in the area of the incisions.  Ice packs will help.  Swelling and bruising can take several days to resolve.  6. It is common to experience some constipation if taking pain medication after surgery.  Increasing fluid intake and taking a stool softener (such as Colace) will usually help or prevent this problem from occurring.  A mild laxative (Milk of Magnesia or Miralax) should be taken according to package instructions if there are no bowel movements after 48 hours. 7. Unless discharge instructions indicate otherwise, you may remove your bandages 72 hours after surgery.  You may shower the day after surgery.  You may have steri-strips (small skin tapes) in place directly over the incision.  These strips should be left on the skin until they fall off.  If your surgeon used skin glue on the incision, you may shower in 24 hours.   The glue will flake off over the next 2-3 weeks.  Any sutures or staples will be removed at the office during your follow-up visit. 8. ACTIVITIES:  You may resume regular (light) daily activities beginning the next day--such as daily self-care, walking, climbing stairs--gradually increasing activities as tolerated.  You may have sexual intercourse when it is comfortable.  Refrain from any heavy lifting or straining for two weeks.  Do not lift anything over 10 pounds during that time.  a. You may drive when you are no longer taking prescription pain medication, you can comfortably wear a seatbelt, and you can safely maneuver your car and apply brakes. b. RETURN TO WORK:  Desk type work in 5-7 days, full duty work in 2 weeks if you are pain-free.________________________________________________________ 9. You should see your doctor in the office for a follow-up appointment approximately 2-3 weeks after your surgery.  Make sure that you call for this appointment within a day or two after you arrive home to insure a convenient appointment time. 10. OTHER INSTRUCTIONS: __________________________________________________________________________________________________________________________ __________________________________________________________________________________________________________________________ WHEN TO CALL YOUR DOCTOR: 1. Fever over 101.0 2. Inability to urinate 3. Continued bleeding from incision. 4. Increased pain, redness, or drainage from the incision. 5. Increasing abdominal pain  The clinic staff is available to answer your questions during regular business hours.  Please dont hesitate to call and ask to speak to one of the nurses for clinical concerns.  If you have a medical emergency, go to the nearest emergency room or call 911.  A surgeon from [facility] is always on call at the hospital. [address], [address], [place], [place]  [address] ? [address] [address],  [place], [place]   [address] [phone] ? [phone] ? FAX [phone] Web site: [email or web address]

## 2016-02-14 NOTE — Progress Notes (Signed)
Paged Dr. Carolynne Edouardoth for Benadryl d/t morphine causing itching. Received verbal orders for benadryl.

## 2016-02-14 NOTE — Anesthesia Procedure Notes (Signed)
Procedure Name: Intubation Date/Time: 02/14/2016 10:39 AM Performed by: Wynonia SoursWALKER, Makenzi Bannister L Pre-anesthesia Checklist: Patient identified, Emergency Drugs available, Suction available, Patient being monitored and Timeout performed Patient Re-evaluated:Patient Re-evaluated prior to inductionOxygen Delivery Method: Circle system utilized Preoxygenation: Pre-oxygenation with 100% oxygen Intubation Type: IV induction Ventilation: Mask ventilation without difficulty Laryngoscope Size: Mac and 4 Grade View: Grade II Tube type: Oral Tube size: 7.5 mm Number of attempts: 1 Airway Equipment and Method: Stylet Placement Confirmation: ETT inserted through vocal cords under direct vision,  positive ETCO2,  CO2 detector and breath sounds checked- equal and bilateral Secured at: 21 cm Tube secured with: Tape Dental Injury: Teeth and Oropharynx as per pre-operative assessment

## 2016-02-15 MED ORDER — CIPROFLOXACIN HCL 500 MG PO TABS
500.0000 mg | ORAL_TABLET | Freq: Two times a day (BID) | ORAL | 0 refills | Status: DC
Start: 2016-02-15 — End: 2018-02-10

## 2016-02-15 MED ORDER — METRONIDAZOLE 500 MG PO TABS: 500.0000 mg | ORAL_TABLET | Freq: Three times a day (TID) | ORAL | 0 refills | Status: DC

## 2016-02-15 MED ORDER — HYDROCODONE-ACETAMINOPHEN 5-325 MG PO TABS: 1.0000 | ORAL_TABLET | ORAL | 0 refills | Status: DC | PRN

## 2016-02-15 NOTE — Progress Notes (Signed)
Assessment Active Problems:   Acute appendicitis s/p laparoscopic appendectomy 02/14/16-looks good this AM.     Plan:  Discharge today.  Instructions given to her.   LOS: 2 days     1 Day Post-Op  Subjective: A little sore. Tolerating diet.  Sitting up in chair talking on phone.  Objective: Vital signs in last 24 hours: Temp:  [97.9 F (36.6 C)-98.4 F (36.9 C)] 98 F (36.7 C) (11/03 0550) Pulse Rate:  [77-104] 88 (11/03 0550) Resp:  [12-19] 16 (11/03 0550) BP: (99-145)/(47-113) 99/47 (11/03 0550) SpO2:  [93 %-99 %] 98 % (11/03 0550)    Intake/Output from previous day: 11/02 0701 - 11/03 0700 In: 3980 [P.O.:480; I.V.:3200; IV Piggyback:300] Out: 3600 [Urine:3550; Blood:50] Intake/Output this shift: No intake/output data recorded.  PE: General- In NAD Abdomen-dressings dry  Lab Results:   Recent Labs  02/13/16 2000  WBC 22.7*  HGB 13.3  HCT 39.4  PLT 405*   BMET  Recent Labs  02/13/16 2000  NA 137  K 3.4*  CL 107  CO2 22  GLUCOSE 96  BUN 12  CREATININE 0.82  CALCIUM 9.2   PT/INR No results for input(s): LABPROT, INR in the last 72 hours. Comprehensive Metabolic Panel:    Component Value Date/Time   NA 137 02/13/2016 2000   K 3.4 (L) 02/13/2016 2000   CL 107 02/13/2016 2000   CO2 22 02/13/2016 2000   BUN 12 02/13/2016 2000   CREATININE 0.82 02/13/2016 2000   GLUCOSE 96 02/13/2016 2000   CALCIUM 9.2 02/13/2016 2000   AST 23 02/13/2016 2000   ALT 19 02/13/2016 2000   ALKPHOS 105 02/13/2016 2000   BILITOT 0.6 02/13/2016 2000   PROT 7.6 02/13/2016 2000   ALBUMIN 3.9 02/13/2016 2000     Studies/Results: Ct Abdomen Pelvis W Contrast  Result Date: 02/13/2016 CLINICAL DATA:  Acute onset of right lower quadrant abdominal pain. Initial encounter. EXAM: CT ABDOMEN AND PELVIS WITH CONTRAST TECHNIQUE: Multidetector CT imaging of the abdomen and pelvis was performed using the standard protocol following bolus administration of intravenous  contrast. CONTRAST:  100mL ISOVUE-300 IOPAMIDOL (ISOVUE-300) INJECTION 61% COMPARISON:  None. FINDINGS: Lower chest: Minimal right basilar atelectasis is noted. The visualized lung bases are otherwise clear. The visualized portions of the mediastinum are unremarkable. Hepatobiliary: The liver is unremarkable in appearance. A stone is noted within the gallbladder. The gallbladder is otherwise unremarkable. The common bile duct remains normal in caliber. Pancreas: The pancreas is within normal limits. Spleen: The spleen is unremarkable in appearance. Adrenals/Urinary Tract: The adrenal glands are unremarkable in appearance. The kidneys are within normal limits. There is no evidence of hydronephrosis. No renal or ureteral stones are identified. No perinephric stranding is seen. Stomach/Bowel: There is dilatation of the appendix to 1.2 cm in maximal diameter, with mild surrounding soft tissue inflammation and trace fluid. Appendicoliths are noted within the appendix, measuring up to 0.9 cm in size. There is no evidence of perforation or abscess formation at this time. Contrast progresses to the level of the splenic flexure of the colon. The colon is grossly unremarkable in appearance. Visualized small bowel loops are grossly unremarkable. The stomach is unremarkable in appearance. Vascular/Lymphatic: The abdominal aorta is unremarkable in appearance. The inferior vena cava is grossly unremarkable. No retroperitoneal lymphadenopathy is seen. No pelvic sidewall lymphadenopathy is identified. Reproductive: The bladder is mildly distended and within normal limits. The uterus is grossly unremarkable in appearance. The ovaries are relatively symmetric. No suspicious adnexal masses are  seen. Other: No additional soft tissue abnormalities are seen. Musculoskeletal: No acute osseous abnormalities are identified. The visualized musculature is unremarkable in appearance. IMPRESSION: 1. Acute appendicitis, with dilatation of the  appendix to 1.2 cm in maximal diameter. Mild surrounding soft tissue inflammation and trace fluid. Appendicoliths noted within the appendix, measuring up to 0.9 cm in size. 2. Cholelithiasis.  Gallbladder otherwise unremarkable. These results were called by telephone at the time of interpretation on 02/13/2016 at 9:54 pm to Dr. Doug SouSAM JACUBOWITZ, who verbally acknowledged these results. Electronically Signed   By: Roanna RaiderJeffery  Chang M.D.   On: 02/13/2016 21:57    Anti-infectives: Anti-infectives    Start     Dose/Rate Route Frequency Ordered Stop   02/14/16 0015  ciprofloxacin (CIPRO) IVPB 400 mg     400 mg 200 mL/hr over 60 Minutes Intravenous Every 12 hours 02/14/16 0015     02/14/16 0015  metroNIDAZOLE (FLAGYL) IVPB 500 mg     500 mg 100 mL/hr over 60 Minutes Intravenous Every 8 hours 02/14/16 0015     02/13/16 2230  metroNIDAZOLE (FLAGYL) IVPB 500 mg  Status:  Discontinued     500 mg 100 mL/hr over 60 Minutes Intravenous Every 8 hours 02/13/16 2223 02/14/16 0022   02/13/16 2215  cefTRIAXone (ROCEPHIN) 2 g in dextrose 5 % 50 mL IVPB     2 g 100 mL/hr over 30 Minutes Intravenous  Once 02/13/16 2207 02/14/16 0003   02/13/16 2215  metroNIDAZOLE (FLAGYL) IVPB 500 mg     500 mg 100 mL/hr over 60 Minutes Intravenous  Once 02/13/16 2207 02/13/16 2333       Ashtynn Berke J 02/15/2016

## 2016-02-15 NOTE — Progress Notes (Signed)
Discharge instructions and prescriptions given to patients

## 2016-02-25 NOTE — Discharge Summary (Signed)
Physician Discharge Summary  Patient ID: Monique Rivers MRN: [id number] DOB/AGE: October 25, 1965 50 y.o.  Admit date: 02/13/2016 Discharge date: 02/15/2016  Admission Diagnoses:  Acute appendicitis  Discharge Diagnoses:  Active Problems:   Acute appendicitis s/p laparoscopic appendectomy 02/14/2016.      Discharged Condition: good  Hospital Course: She was admitted, started on IV antibiotics, and underwent a laparoscopic appendectomy 02/14/2016. She tolerated this well and was able to be discharged on her first postoperative day. Discharge instructions were given to her.   Discharge Exam: Blood pressure (!) 99/47, pulse 88, temperature 98 F (36.7 C), temperature source Oral, resp. rate 16, SpO2 98 %.   Disposition: 01-Home or Self Care     Medication List    TAKE these medications   aspirin 81 MG chewable tablet Chew 81 mg by mouth daily.   CALCIUM PO Take 1 tablet by mouth daily.   cetirizine 10 MG tablet Commonly known as:  ZYRTEC Take 10 mg by mouth daily.   ciprofloxacin 500 MG tablet Commonly known as:  CIPRO Take 1 tablet (500 mg total) by mouth 2 (two) times daily.   fluticasone 50 MCG/ACT nasal spray Commonly known as:  FLONASE Place 1 spray into both nostrils daily.   HYDROcodone-acetaminophen 5-325 MG tablet Commonly known as:  NORCO/VICODIN Take 1-2 tablets by mouth every 4 (four) hours as needed (severe pain).   LORYNA 3-0.02 MG tablet Generic drug:  drospirenone-ethinyl estradiol Take 1 tablet by mouth daily.   metroNIDAZOLE 500 MG tablet Commonly known as:  FLAGYL Take 1 tablet (500 mg total) by mouth 3 (three) times daily.   multivitamin with minerals Tabs tablet Take 1 tablet by mouth daily.   pantoprazole 40 MG tablet Commonly known as:  PROTONIX Take 40 mg by mouth daily.      Follow-up Information    Adolph PollackOSENBOWER,Torry Istre J, MD. Go on 03/04/2016.   Specialty:  General Surgery Why:  Your appointment is 03/04/2016 at 9am. Please arrive 30  minutes prior to your appointment to fill out necessary paperwork. Contact information: [address] [place] [address] [phone]           Signed: Adolph PollackROSENBOWER,Cylah Fannin J 02/25/2016, 7:55 PM

## 2017-01-23 DIAGNOSIS — N3001 Acute cystitis with hematuria: Secondary | ICD-10-CM | POA: Diagnosis not present

## 2017-01-31 DIAGNOSIS — R82998 Other abnormal findings in urine: Secondary | ICD-10-CM | POA: Diagnosis not present

## 2017-01-31 DIAGNOSIS — R319 Hematuria, unspecified: Secondary | ICD-10-CM | POA: Diagnosis not present

## 2017-02-02 DIAGNOSIS — R109 Unspecified abdominal pain: Secondary | ICD-10-CM | POA: Diagnosis not present

## 2017-02-02 DIAGNOSIS — R310 Gross hematuria: Secondary | ICD-10-CM | POA: Diagnosis not present

## 2017-02-03 DIAGNOSIS — R310 Gross hematuria: Secondary | ICD-10-CM | POA: Diagnosis not present

## 2017-03-11 DIAGNOSIS — Z6834 Body mass index (BMI) 34.0-34.9, adult: Secondary | ICD-10-CM | POA: Diagnosis not present

## 2017-03-11 DIAGNOSIS — Z01419 Encounter for gynecological examination (general) (routine) without abnormal findings: Secondary | ICD-10-CM | POA: Diagnosis not present

## 2017-03-11 DIAGNOSIS — Z1151 Encounter for screening for human papillomavirus (HPV): Secondary | ICD-10-CM | POA: Diagnosis not present

## 2017-03-11 DIAGNOSIS — Z3041 Encounter for surveillance of contraceptive pills: Secondary | ICD-10-CM | POA: Diagnosis not present

## 2017-03-19 DIAGNOSIS — D2262 Melanocytic nevi of left upper limb, including shoulder: Secondary | ICD-10-CM | POA: Diagnosis not present

## 2017-03-19 DIAGNOSIS — D2271 Melanocytic nevi of right lower limb, including hip: Secondary | ICD-10-CM | POA: Diagnosis not present

## 2017-03-19 DIAGNOSIS — Z86018 Personal history of other benign neoplasm: Secondary | ICD-10-CM | POA: Diagnosis not present

## 2017-03-19 DIAGNOSIS — D2372 Other benign neoplasm of skin of left lower limb, including hip: Secondary | ICD-10-CM | POA: Diagnosis not present

## 2017-03-25 DIAGNOSIS — Z0000 Encounter for general adult medical examination without abnormal findings: Secondary | ICD-10-CM | POA: Diagnosis not present

## 2017-03-25 DIAGNOSIS — Z23 Encounter for immunization: Secondary | ICD-10-CM | POA: Diagnosis not present

## 2017-03-25 DIAGNOSIS — Z7189 Other specified counseling: Secondary | ICD-10-CM | POA: Diagnosis not present

## 2017-04-01 DIAGNOSIS — Z0000 Encounter for general adult medical examination without abnormal findings: Secondary | ICD-10-CM | POA: Diagnosis not present

## 2017-08-27 DIAGNOSIS — H524 Presbyopia: Secondary | ICD-10-CM | POA: Diagnosis not present

## 2017-08-27 DIAGNOSIS — Z83511 Family history of glaucoma: Secondary | ICD-10-CM | POA: Diagnosis not present

## 2017-08-27 DIAGNOSIS — H43393 Other vitreous opacities, bilateral: Secondary | ICD-10-CM | POA: Diagnosis not present

## 2017-08-27 DIAGNOSIS — H16223 Keratoconjunctivitis sicca, not specified as Sjogren's, bilateral: Secondary | ICD-10-CM | POA: Diagnosis not present

## 2017-08-27 DIAGNOSIS — H52203 Unspecified astigmatism, bilateral: Secondary | ICD-10-CM | POA: Diagnosis not present

## 2017-09-08 DIAGNOSIS — Z8371 Family history of colonic polyps: Secondary | ICD-10-CM | POA: Diagnosis not present

## 2017-09-08 DIAGNOSIS — K219 Gastro-esophageal reflux disease without esophagitis: Secondary | ICD-10-CM | POA: Diagnosis not present

## 2017-09-08 DIAGNOSIS — R1032 Left lower quadrant pain: Secondary | ICD-10-CM | POA: Diagnosis not present

## 2017-09-08 DIAGNOSIS — Z1211 Encounter for screening for malignant neoplasm of colon: Secondary | ICD-10-CM | POA: Diagnosis not present

## 2017-10-06 DIAGNOSIS — R2242 Localized swelling, mass and lump, left lower limb: Secondary | ICD-10-CM | POA: Diagnosis not present

## 2017-10-06 DIAGNOSIS — M25562 Pain in left knee: Secondary | ICD-10-CM | POA: Diagnosis not present

## 2017-10-10 DIAGNOSIS — M79605 Pain in left leg: Secondary | ICD-10-CM | POA: Diagnosis not present

## 2017-10-12 ENCOUNTER — Encounter: Payer: Self-pay | Admitting: Family Medicine

## 2017-10-12 DIAGNOSIS — K648 Other hemorrhoids: Secondary | ICD-10-CM | POA: Diagnosis not present

## 2017-10-12 DIAGNOSIS — Z8371 Family history of colonic polyps: Secondary | ICD-10-CM | POA: Diagnosis not present

## 2017-10-12 DIAGNOSIS — R1032 Left lower quadrant pain: Secondary | ICD-10-CM | POA: Diagnosis not present

## 2017-10-20 DIAGNOSIS — R1032 Left lower quadrant pain: Secondary | ICD-10-CM | POA: Diagnosis not present

## 2017-10-20 DIAGNOSIS — M79605 Pain in left leg: Secondary | ICD-10-CM | POA: Diagnosis not present

## 2017-11-06 DIAGNOSIS — R109 Unspecified abdominal pain: Secondary | ICD-10-CM | POA: Diagnosis not present

## 2017-11-06 DIAGNOSIS — K8080 Other cholelithiasis without obstruction: Secondary | ICD-10-CM | POA: Diagnosis not present

## 2017-11-06 DIAGNOSIS — R1032 Left lower quadrant pain: Secondary | ICD-10-CM | POA: Diagnosis not present

## 2017-11-23 DIAGNOSIS — Z1231 Encounter for screening mammogram for malignant neoplasm of breast: Secondary | ICD-10-CM | POA: Diagnosis not present

## 2018-02-09 NOTE — Progress Notes (Addendum)
[facility] [address], [address] [place], [place] [address] [phone]  Date:  02/10/2018   Name:  Monique Rivers   DOB:  [birth year]   MRN:  [id number]  PCP:  Pearline Cables, MD    Chief Complaint: New Patient (Initial Visit) (flu shot)   History of Present Illness:  Monique Rivers is a 52 y.o. very pleasant female patient who presents with the following:  New patient here today to establish care- we decided to do a CPE for her today history of appendicitis in 2017 She sees GYN-Dr. 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 All looked ok per her report Her HDL is generally high per her report   She does use allergy meds, OCP, and Protonix for GERD She knows how to manage her allergies and to keep from getting out of hand She uses zyrtec and also flonase as needed  Her GI doctor wants her to stay on PPI for the long term  Her GI doc is Building services engineer with [facility]  She did do a colonoscopy earlier this year and it was ok   Admits that she does not like to eat a whole lot of vegetables   Patient Active Problem List   Diagnosis Date Noted  . Acute appendicitis 02/13/2016    History reviewed. No pertinent past medical history.  Past Surgical History:  Procedure Laterality Date  . LAPAROSCOPIC APPENDECTOMY N/A 02/14/2016   Procedure: APPENDECTOMY LAPAROSCOPIC;  Surgeon: Avel Peace, MD;  Location: [facility];  Service: General;  Laterality: N/A;    Social History   Tobacco Use  . Smoking status: Never Smoker  . Smokeless tobacco: Never Used  Substance Use Topics  . Alcohol use: Yes    Comment: occasionally  . Drug use: Never    Family History  Problem Relation Age of Onset  . Diabetes Mother   . Hearing loss Mother   . Early death Mother   . Hypertension Father   . Hyperlipidemia Father   . Stroke Father   . Miscarriages / Stillbirths Sister   . Hearing loss Maternal Grandmother   . Hyperlipidemia Maternal Grandmother   . Hypertension Maternal Grandmother   . Heart disease Maternal Grandmother   . Stroke Maternal Grandmother   . Heart attack Maternal Grandfather   . Early death Maternal Grandfather   . Early death Paternal Grandmother   . Cancer Paternal Grandmother   . Early death Paternal Grandfather   . Heart attack Paternal Grandfather     Allergies  Allergen Reactions  . Amoxicillin Nausea And Vomiting    Has patient had a PCN reaction causing immediate rash, facial/tongue/throat swelling, SOB or lightheadedness with hypotension: No Has patient had a PCN reaction causing  severe rash involving mucus membranes or skin necrosis: No Has patient had a PCN reaction that required hospitalization: No Has patient had a PCN reaction occurring within the last 10 years: No If all of the above answers are "NO", then may proceed with Cephalosporin use.   Marland Kitchen Hydrocodone-Acetaminophen Other (See Comments)    "made me crazy"  . [place]  [Triamcinolone] Rash    Medication list has been reviewed and updated.  Current Outpatient Medications on File Prior to Visit  Medication Sig Dispense Refill  . CALCIUM PO Take 1 tablet by mouth daily.    . cetirizine (ZYRTEC) 10 MG tablet Take 10 mg by mouth daily.    . fluticasone (FLONASE) 50 MCG/ACT nasal spray Place 1 spray into both nostrils daily.    Devonne Doughty 3-0.02 MG tablet Take 1 tablet by mouth daily.    . Multiple Vitamin (MULTIVITAMIN WITH MINERALS) TABS tablet Take 1 tablet by mouth daily.    . naphazoline (NAPHCON) 0.1 % ophthalmic solution Place 1 drop into both eyes daily as needed.    . pantoprazole (PROTONIX) 40 MG tablet Take 40 mg by mouth daily.     No current facility-administered medications on file prior to visit.     Review of Systems:  As per HPI- otherwise negative. No CP or SOB with exercise No skin changes She does see derm for regular skin checks, no history of skin cancer    Physical Examination: Vitals:   02/10/18 0904  BP: 122/80  Pulse: 81  Resp: 16  Temp: 98 F (36.7 C)  SpO2: 96%   Vitals:   02/10/18 0904  Weight: 193 lb (87.5 kg)  Height: 5\' 3"  (1.6 m)   Body mass index is 34.19 kg/m. Ideal Body Weight: Weight in (lb) to have BMI = 25: 140.8  GEN: WDWN, NAD, Non-toxic, A & O x 3, overweight, otherwise looks well  HEENT: Atraumatic, Normocephalic. Neck supple. No masses, No LAD.  Bilateral TM wnl, oropharynx normal.  PEERL,EOMI.   Ears and Nose: No external deformity. CV: RRR, No M/G/R. No JVD. No thrill. No extra heart sounds. PULM: CTA B, no wheezes, crackles, rhonchi. No retractions. No resp. distress. No accessory muscle use. ABD: S, NT, ND, +BS. No rebound. No HSM. EXTR: No c/c/e NEURO Normal gait.  PSYCH: Normally interactive. Conversant. Not depressed or anxious appearing.  Calm demeanor.    Assessment and Plan: Physical exam  Immunization due - Plan: Td vaccine greater than or equal to 7yo preservative free IM  Screening for hyperlipidemia - Plan: Lipid panel  Screening for deficiency anemia - Plan: CBC  Screening for diabetes mellitus - Plan: Comprehensive metabolic panel, Hemoglobin A1c  New patient, did CPE for her today as well She is due to have Td and flu shots today She will come in for fasting labs later on this week  Went over heath maint She is exercising  and trying to eat well, continue these efforts Will plan further follow- up pending labs.   Signed Abbe Amsterdam, MD  Received her labs 11/1  Message to pt  The 10-year ASCVD risk score Denman George DC Montez Hageman., et al., 2013) is: 0.8%   Values used to calculate the score:     Age: 60 years     Sex: Female     Is Non-Hispanic African American: No     Diabetic: No     Tobacco smoker: No     Systolic Blood Pressure: 122 mmHg  Is BP treated: No     HDL Cholesterol: 67.3 mg/dL     Total Cholesterol: 202 mg/dL  -Lipid panel      Result                      Value             Ref Range          Cholesterol                 202 (H)           0 - 200 mg/dL      Triglycerides               254.0 (H)         0.0 - 149.0 *      HDL                         67.30             >39.00 mg/dL       VLDL                        50.8 (H)          0.0 - 40.0 m*      Total CHOL/HDL Ratio        3                                    NonHDL                      134.39                          -Hemoglobin A1c      Result                      Value             Ref Range          Hgb A1c MFr Bld             5.6               4.6 - 6.5 %   -Comprehensive metabolic panel      Result                      Value             Ref Range          Sodium                      137               135 - 145 mE*b       Potassium                   4.1               3.5 - 5.1 mE*      Chloride                    105               96 -  112 mEq*      CO2                         21                19 - 32 mEq/L      Glucose, Bld                93                70 - 99 mg/dL      BUN                         7                 6 - 23 mg/dL       Creatinine, Ser             0.66              0.40 - 1.20 *      Total Bilirubin             0.3               0.2 - 1.2 mg*      Alkaline Phosphatase        107               39 - 117 U/L       AST                         14                0 - 37 U/L         ALT                         13                 0 - 35 U/L         Total Protein               6.4               6.0 - 8.3 g/*      Albumin                     3.7               3.5 - 5.2 g/*      Calcium                     8.5               8.4 - 10.5 m*      GFR                         100.77            >60.00 mL/min -CBC      Result                      Value             Ref Range          WBC  9.2               4.0 - 10.5 K*      RBC                         4.67              3.87 - 5.11 *      Platelets                   321.0             150.0 - 400.*      Hemoglobin                  13.4              12.0 - 15.0 *      HCT                         39.5              36.0 - 46.0 %      MCV                         84.6              78.0 - 100.0*      MCHC                        34.0              30.0 - 36.0 *      RDW                         13.6              11.5 - 15.5 % -LDL cholesterol, direct      Result                      Value             Ref Range          Direct LDL                  107.0             mg/dL

## 2018-02-10 ENCOUNTER — Ambulatory Visit (INDEPENDENT_AMBULATORY_CARE_PROVIDER_SITE_OTHER): Payer: BLUE CROSS/BLUE SHIELD | Admitting: Family Medicine

## 2018-02-10 ENCOUNTER — Encounter: Payer: Self-pay | Admitting: Family Medicine

## 2018-02-10 VITALS — BP 122/80 | HR 81 | Temp 98.0°F | Resp 16 | Ht 63.0 in | Wt 193.0 lb

## 2018-02-10 DIAGNOSIS — Z130 Encounter for screening for diseases of the blood and blood-forming organs and certain disorders involving the immune mechanism: Secondary | ICD-10-CM

## 2018-02-10 DIAGNOSIS — Z13220 Encounter for screening for lipoid disorders: Secondary | ICD-10-CM

## 2018-02-10 DIAGNOSIS — Z23 Encounter for immunization: Secondary | ICD-10-CM

## 2018-02-10 DIAGNOSIS — Z0000 Encounter for general adult medical examination without abnormal findings: Secondary | ICD-10-CM | POA: Diagnosis not present

## 2018-02-10 DIAGNOSIS — Z131 Encounter for screening for diabetes mellitus: Secondary | ICD-10-CM

## 2018-02-10 NOTE — Patient Instructions (Addendum)
Good to meet you today! Your got your tetanus and flu shot today  Please come in for your lab draw fasting at your convenience You should always hear from me regarding any labs or other results.  I will use mychart first if this is activated   Health Maintenance, Female Adopting a healthy lifestyle and getting preventive care can go a long way to promote health and wellness. Talk with your health care provider about what schedule of regular examinations is right for you. This is a good chance for you to check in with your provider about disease prevention and staying healthy. In between checkups, there are plenty of things you can do on your own. Experts have done a lot of research about which lifestyle changes and preventive measures are most likely to keep you healthy. Ask your health care provider for more information. Weight and diet Eat a healthy diet  Be sure to include plenty of vegetables, fruits, low-fat dairy products, and lean protein.  Do not eat a lot of foods high in solid fats, added sugars, or salt.  Get regular exercise. This is one of the most important things you can do for your health. ? Most adults should exercise for at least 150 minutes each week. The exercise should increase your heart rate and make you sweat (moderate-intensity exercise). ? Most adults should also do strengthening exercises at least twice a week. This is in addition to the moderate-intensity exercise.  Maintain a healthy weight  Body mass index (BMI) is a measurement that can be used to identify possible weight problems. It estimates body fat based on height and weight. Your health care provider can help determine your BMI and help you achieve or maintain a healthy weight.  For females 53 years of age and older: ? A BMI below 18.5 is considered underweight. ? A BMI of 18.5 to 24.9 is normal. ? A BMI of 25 to 29.9 is considered overweight. ? A BMI of 30 and above is considered obese.  Watch levels  of cholesterol and blood lipids  You should start having your blood tested for lipids and cholesterol at 49 years of age, then have this test every 5 years.  You may need to have your cholesterol levels checked more often if: ? Your lipid or cholesterol levels are high. ? You are older than 50 years of age. ? You are at high risk for heart disease.  Cancer screening Lung Cancer  Lung cancer screening is recommended for adults 37-58 years old who are at high risk for lung cancer because of a history of smoking.  A yearly low-dose CT scan of the lungs is recommended for people who: ? Currently smoke. ? Have quit within the past 15 years. ? Have at least a 30-pack-year history of smoking. A pack year is smoking an average of one pack of cigarettes a day for 1 year.  Yearly screening should continue until it has been 15 years since you quit.  Yearly screening should stop if you develop a health problem that would prevent you from having lung cancer treatment.  Breast Cancer  Practice breast self-awareness. This means understanding how your breasts normally appear and feel.  It also means doing regular breast self-exams. Let your health care provider know about any changes, no matter how small.  If you are in your 20s or 30s, you should have a clinical breast exam (CBE) by a health care provider every 1-3 years as part of a regular  health exam.  If you are 40 or older, have a CBE every year. Also consider having a breast X-ray (mammogram) every year.  If you have a family history of breast cancer, talk to your health care provider about genetic screening.  If you are at high risk for breast cancer, talk to your health care provider about having an MRI and a mammogram every year.  Breast cancer gene (BRCA) assessment is recommended for women who have family members with BRCA-related cancers. BRCA-related cancers include: ? Breast. ? Ovarian. ? Tubal. ? Peritoneal  cancers.  Results of the assessment will determine the need for genetic counseling and BRCA1 and BRCA2 testing.  Cervical Cancer Your health care provider may recommend that you be screened regularly for cancer of the pelvic organs (ovaries, uterus, and vagina). This screening involves a pelvic examination, including checking for microscopic changes to the surface of your cervix (Pap test). You may be encouraged to have this screening done every 3 years, beginning at age 41.  For women ages 56-65, health care providers may recommend pelvic exams and Pap testing every 3 years, or they may recommend the Pap and pelvic exam, combined with testing for human papilloma virus (HPV), every 5 years. Some types of HPV increase your risk of cervical cancer. Testing for HPV may also be done on women of any age with unclear Pap test results.  Other health care providers may not recommend any screening for nonpregnant women who are considered low risk for pelvic cancer and who do not have symptoms. Ask your health care provider if a screening pelvic exam is right for you.  If you have had past treatment for cervical cancer or a condition that could lead to cancer, you need Pap tests and screening for cancer for at least 20 years after your treatment. If Pap tests have been discontinued, your risk factors (such as having a new sexual partner) need to be reassessed to determine if screening should resume. Some women have medical problems that increase the chance of getting cervical cancer. In these cases, your health care provider may recommend more frequent screening and Pap tests.  Colorectal Cancer  This type of cancer can be detected and often prevented.  Routine colorectal cancer screening usually begins at 50 years of age and continues through 50 years of age.  Your health care provider may recommend screening at an earlier age if you have risk factors for colon cancer.  Your health care provider may also  recommend using home test kits to check for hidden blood in the stool.  A small camera at the end of a tube can be used to examine your colon directly (sigmoidoscopy or colonoscopy). This is done to check for the earliest forms of colorectal cancer.  Routine screening usually begins at age 71.  Direct examination of the colon should be repeated every 5-10 years through 50 years of age. However, you may need to be screened more often if early forms of precancerous polyps or small growths are found.  Skin Cancer  Check your skin from head to toe regularly.  Tell your health care provider about any new moles or changes in moles, especially if there is a change in a mole's shape or color.  Also tell your health care provider if you have a mole that is larger than the size of a pencil eraser.  Always use sunscreen. Apply sunscreen liberally and repeatedly throughout the day.  Protect yourself by wearing long sleeves, pants, a  wide-brimmed hat, and sunglasses whenever you are outside.  Heart disease, diabetes, and high blood pressure  High blood pressure causes heart disease and increases the risk of stroke. High blood pressure is more likely to develop in: ? People who have blood pressure in the high end of the normal range (130-139/85-89 mm Hg). ? People who are overweight or obese. ? People who are African American.  If you are 8-4 years of age, have your blood pressure checked every 3-5 years. If you are 52 years of age or older, have your blood pressure checked every year. You should have your blood pressure measured twice-once when you are at a hospital or clinic, and once when you are not at a hospital or clinic. Record the average of the two measurements. To check your blood pressure when you are not at a hospital or clinic, you can use: ? An automated blood pressure machine at a pharmacy. ? A home blood pressure monitor.  If you are between 41 years and 65 years old, ask your  health care provider if you should take aspirin to prevent strokes.  Have regular diabetes screenings. This involves taking a blood sample to check your fasting blood sugar level. ? If you are at a normal weight and have a low risk for diabetes, have this test once every three years after 50 years of age. ? If you are overweight and have a high risk for diabetes, consider being tested at a younger age or more often. Preventing infection Hepatitis B  If you have a higher risk for hepatitis B, you should be screened for this virus. You are considered at high risk for hepatitis B if: ? You were born in a country where hepatitis B is common. Ask your health care provider which countries are considered high risk. ? Your parents were born in a high-risk country, and you have not been immunized against hepatitis B (hepatitis B vaccine). ? You have HIV or AIDS. ? You use needles to inject street drugs. ? You live with someone who has hepatitis B. ? You have had sex with someone who has hepatitis B. ? You get hemodialysis treatment. ? You take certain medicines for conditions, including cancer, organ transplantation, and autoimmune conditions.  Hepatitis C  Blood testing is recommended for: ? Everyone born from [birth year] through 1963. ? Anyone with known risk factors for hepatitis C.  Sexually transmitted infections (STIs)  You should be screened for sexually transmitted infections (STIs) including gonorrhea and chlamydia if: ? You are sexually active and are younger than 50 years of age. ? You are older than 50 years of age and your health care provider tells you that you are at risk for this type of infection. ? Your sexual activity has changed since you were last screened and you are at an increased risk for chlamydia or gonorrhea. Ask your health care provider if you are at risk.  If you do not have HIV, but are at risk, it may be recommended that you take a prescription medicine daily to  prevent HIV infection. This is called pre-exposure prophylaxis (PrEP). You are considered at risk if: ? You are sexually active and do not regularly use condoms or know the HIV status of your partner(s). ? You take drugs by injection. ? You are sexually active with a partner who has HIV.  Talk with your health care provider about whether you are at high risk of being infected with HIV. If you choose to  begin PrEP, you should first be tested for HIV. You should then be tested every 3 months for as long as you are taking PrEP. Pregnancy  If you are premenopausal and you may become pregnant, ask your health care provider about preconception counseling.  If you may become pregnant, take 400 to 800 micrograms (mcg) of folic acid every day.  If you want to prevent pregnancy, talk to your health care provider about birth control (contraception). Osteoporosis and menopause  Osteoporosis is a disease in which the bones lose minerals and strength with aging. This can result in serious bone fractures. Your risk for osteoporosis can be identified using a bone density scan.  If you are 76 years of age or older, or if you are at risk for osteoporosis and fractures, ask your health care provider if you should be screened.  Ask your health care provider whether you should take a calcium or vitamin D supplement to lower your risk for osteoporosis.  Menopause may have certain physical symptoms and risks.  Hormone replacement therapy may reduce some of these symptoms and risks. Talk to your health care provider about whether hormone replacement therapy is right for you. Follow these instructions at home:  Schedule regular health, dental, and eye exams.  Stay current with your immunizations.  Do not use any tobacco products including cigarettes, chewing tobacco, or electronic cigarettes.  If you are pregnant, do not drink alcohol.  If you are breastfeeding, limit how much and how often you drink  alcohol.  Limit alcohol intake to no more than 1 drink per day for nonpregnant women. One drink equals 12 ounces of beer, 5 ounces of wine, or 1 ounces of hard liquor.  Do not use street drugs.  Do not share needles.  Ask your health care provider for help if you need support or information about quitting drugs.  Tell your health care provider if you often feel depressed.  Tell your health care provider if you have ever been abused or do not feel safe at home. This information is not intended to replace advice given to you by your health care provider. Make sure you discuss any questions you have with your health care provider. Document Released: 10/14/2010 Document Revised: 09/06/2015 Document Reviewed: 01/02/2015 Elsevier Interactive Patient Education  [employer].

## 2018-02-12 ENCOUNTER — Encounter: Payer: Self-pay | Admitting: Family Medicine

## 2018-02-12 ENCOUNTER — Other Ambulatory Visit (INDEPENDENT_AMBULATORY_CARE_PROVIDER_SITE_OTHER): Payer: BLUE CROSS/BLUE SHIELD

## 2018-02-12 DIAGNOSIS — Z131 Encounter for screening for diabetes mellitus: Secondary | ICD-10-CM

## 2018-02-12 DIAGNOSIS — Z13220 Encounter for screening for lipoid disorders: Secondary | ICD-10-CM

## 2018-02-12 DIAGNOSIS — Z130 Encounter for screening for diseases of the blood and blood-forming organs and certain disorders involving the immune mechanism: Secondary | ICD-10-CM | POA: Diagnosis not present

## 2018-02-12 LAB — COMPREHENSIVE METABOLIC PANEL
ALBUMIN: 3.7 g/dL (ref 3.5–5.2)
ALK PHOS: 107 U/L (ref 39–117)
ALT: 13 U/L (ref 0–35)
AST: 14 U/L (ref 0–37)
BUN: 7 mg/dL (ref 6–23)
CALCIUM: 8.5 mg/dL (ref 8.4–10.5)
CHLORIDE: 105 meq/L (ref 96–112)
CO2: 21 mEq/L (ref 19–32)
Creatinine, Ser: 0.66 mg/dL (ref 0.40–1.20)
GFR: 100.77 mL/min (ref >60.00)
Glucose, Bld: 93 mg/dL (ref 70–99)
Potassium: 4.1 mEq/L (ref 3.5–5.1)
Sodium: 137 mEq/L (ref 135–145)
TOTAL PROTEIN: 6.4 g/dL (ref 6.0–8.3)
Total Bilirubin: 0.3 mg/dL (ref 0.2–1.2)

## 2018-02-12 LAB — LIPID PANEL
CHOL/HDL RATIO: 3
CHOLESTEROL: 202 mg/dL — AB (ref 0–200)
HDL: 67.3 mg/dL (ref >39.00)
NonHDL: 134.39
TRIGLYCERIDES: 254 mg/dL — AB (ref 0.0–149.0)
VLDL: 50.8 mg/dL — AB (ref 0.0–40.0)

## 2018-02-12 LAB — CBC
HEMATOCRIT: 39.5 % (ref 36.0–46.0)
HEMOGLOBIN: 13.4 g/dL (ref 12.0–15.0)
MCHC: 34 g/dL (ref 30.0–36.0)
MCV: 84.6 fl (ref 78.0–100.0)
Platelets: 321 10*3/uL (ref 150.0–400.0)
RBC: 4.67 Mil/uL (ref 3.87–5.11)
RDW: 13.6 % (ref 11.5–15.5)
WBC: 9.2 10*3/uL (ref 4.0–10.5)

## 2018-02-12 LAB — LDL CHOLESTEROL, DIRECT: LDL DIRECT: 107 mg/dL

## 2018-02-12 LAB — HEMOGLOBIN A1C: Hgb A1c MFr Bld: 5.6 % (ref 4.6–6.5)

## 2018-04-28 DIAGNOSIS — Z01419 Encounter for gynecological examination (general) (routine) without abnormal findings: Secondary | ICD-10-CM | POA: Diagnosis not present

## 2018-04-28 DIAGNOSIS — Z3041 Encounter for surveillance of contraceptive pills: Secondary | ICD-10-CM | POA: Diagnosis not present

## 2018-05-21 ENCOUNTER — Ambulatory Visit: Payer: BLUE CROSS/BLUE SHIELD | Admitting: Family Medicine

## 2018-05-21 DIAGNOSIS — Z0289 Encounter for other administrative examinations: Secondary | ICD-10-CM

## 2018-05-21 DIAGNOSIS — J069 Acute upper respiratory infection, unspecified: Secondary | ICD-10-CM | POA: Diagnosis not present

## 2018-05-26 DIAGNOSIS — R1032 Left lower quadrant pain: Secondary | ICD-10-CM | POA: Diagnosis not present

## 2018-05-26 IMAGING — CT CT ABD-PELV W/ CM
2 of 4 series · 16 of 46 positions shown, 18 images · IV contrast (ISOVUE)
Comparison: None.

CLINICAL DATA: Acute onset of right lower quadrant abdominal pain.
Initial encounter.

EXAM:
CT ABDOMEN AND PELVIS WITH CONTRAST
TECHNIQUE: Multidetector CT imaging of the abdomen and pelvis was performed
using the standard protocol following bolus administration of
intravenous contrast.
CONTRAST:  100mL [id number] IOPAMIDOL ([id number]) INJECTION 61%

[Series 2: abd/pel with · axial · 0.83mm/px · z∈[+382,+828]mm · 13 of 99 slices shown, 15 images]
[im 5/99  soft-tissue]
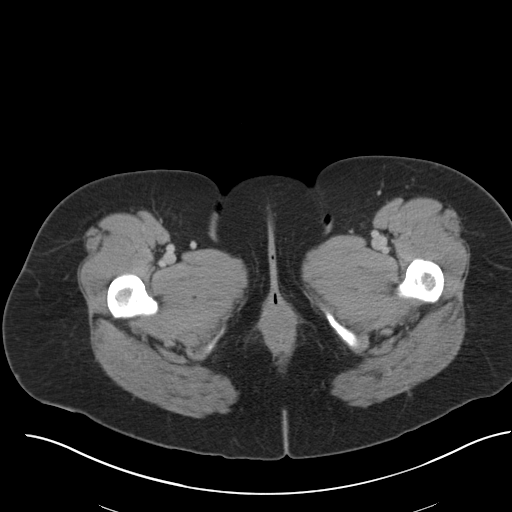
[im 5/99  bone]
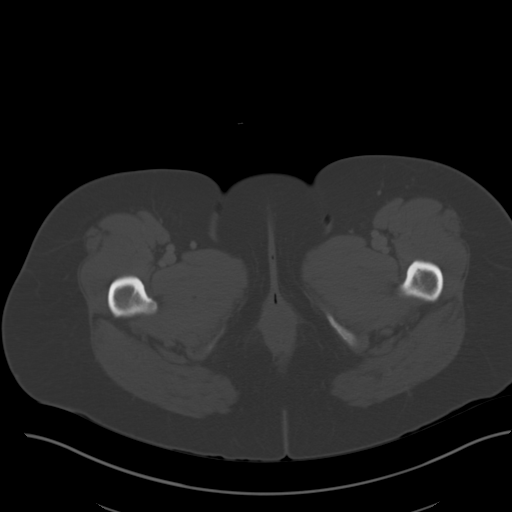
[im 13/99  soft-tissue]
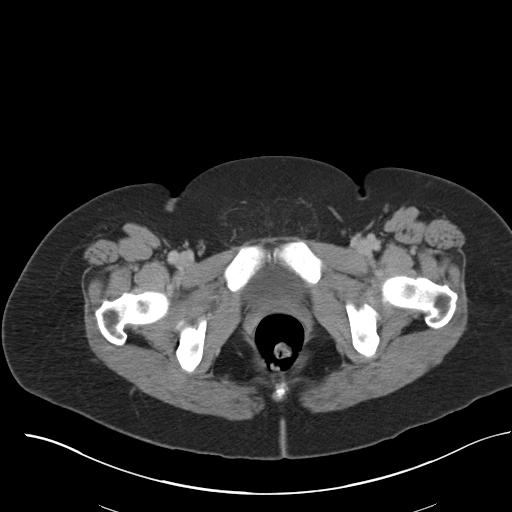
[im 22/99  soft-tissue]
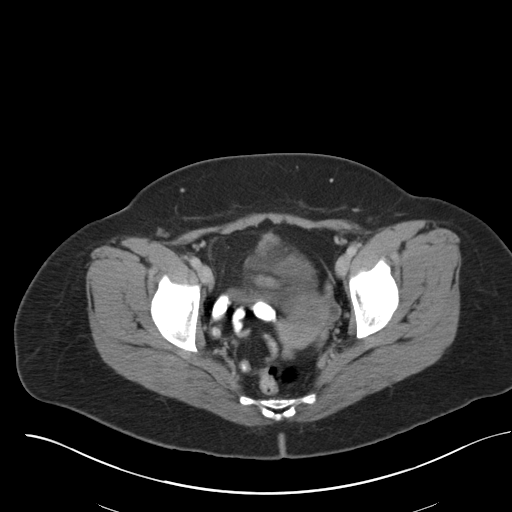
[im 26/99  soft-tissue]
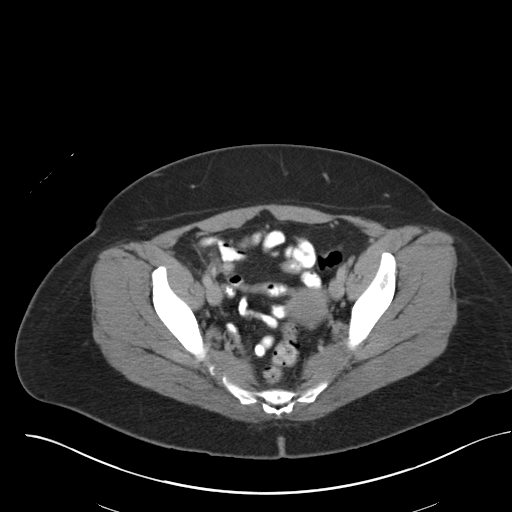
[im 35/99  soft-tissue]
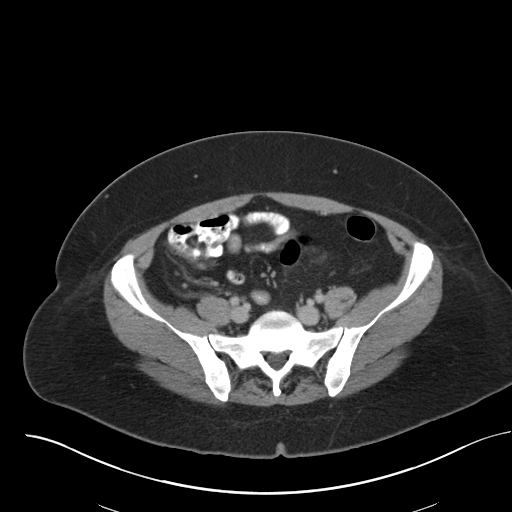
[im 43/99  soft-tissue]
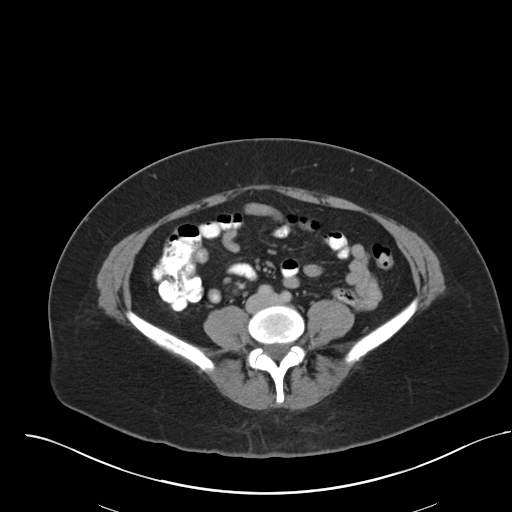
[im 52/99  soft-tissue]
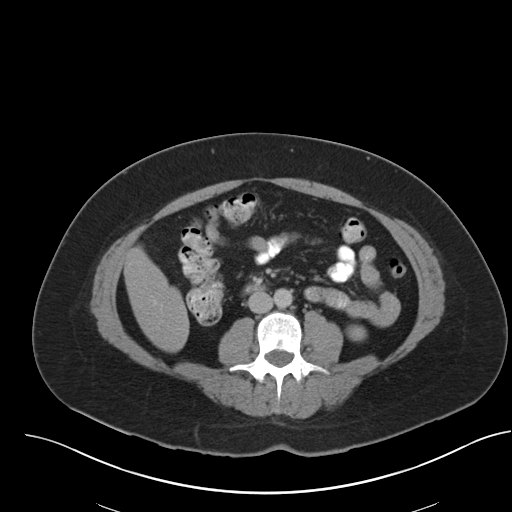
[im 56/99  soft-tissue]
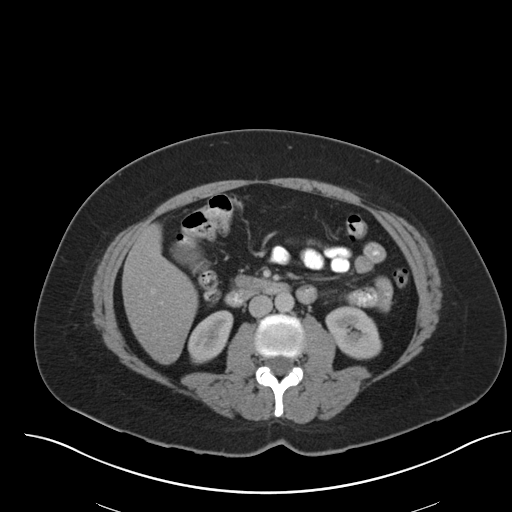
[im 64/99  soft-tissue]
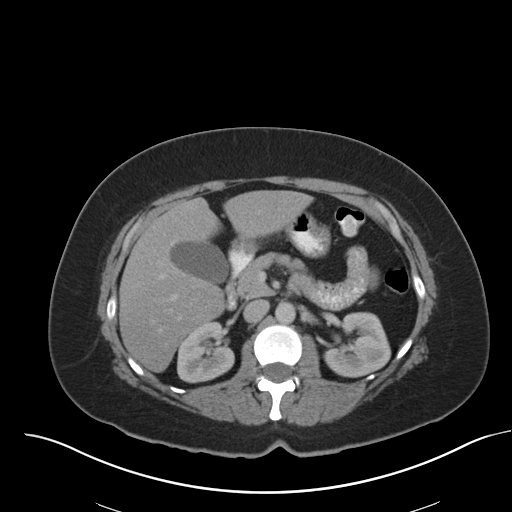
[im 64/99  bone]
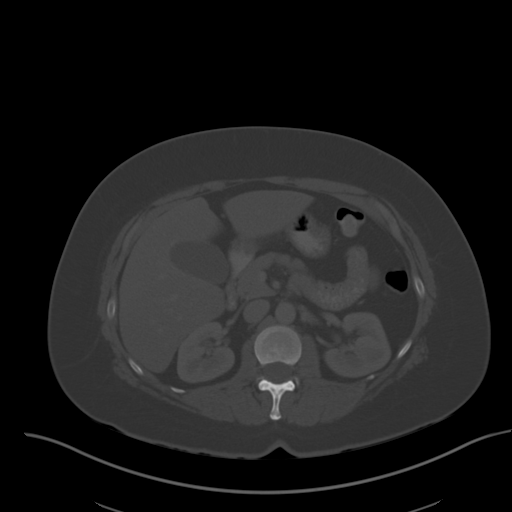
[im 73/99  soft-tissue]
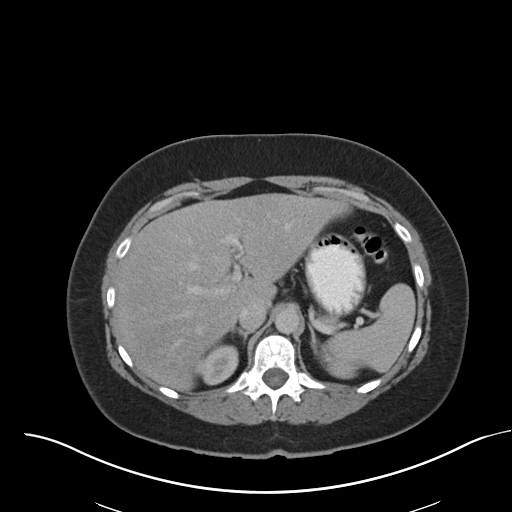
[im 77/99  soft-tissue]
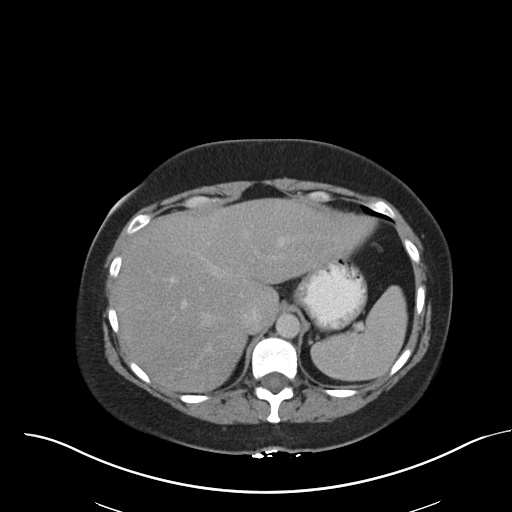
[im 86/99  soft-tissue]
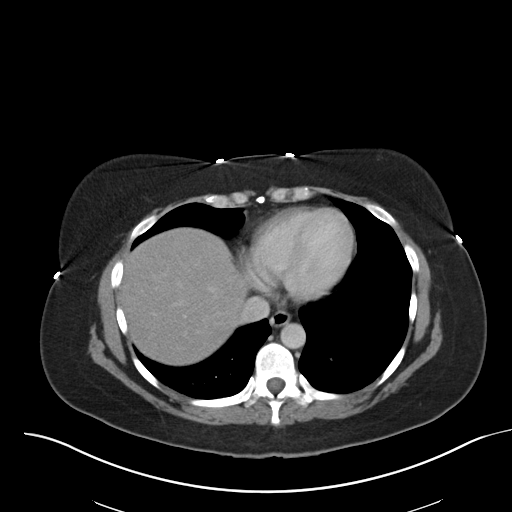
[im 94/99  soft-tissue]
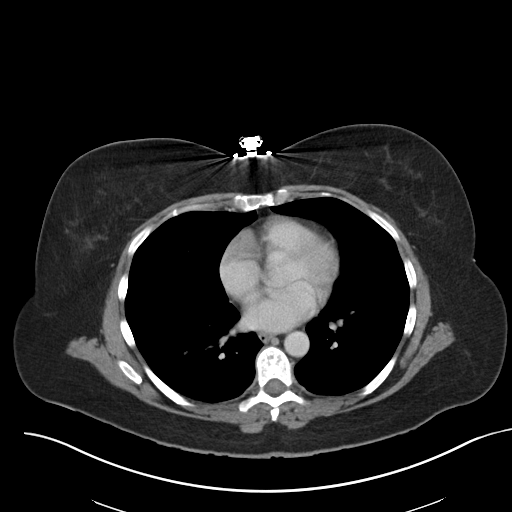

[Series 4: coronal a/|p · coronal · 0.90mm/px · 3 of 152 slices shown]
[im 51/152  soft-tissue]
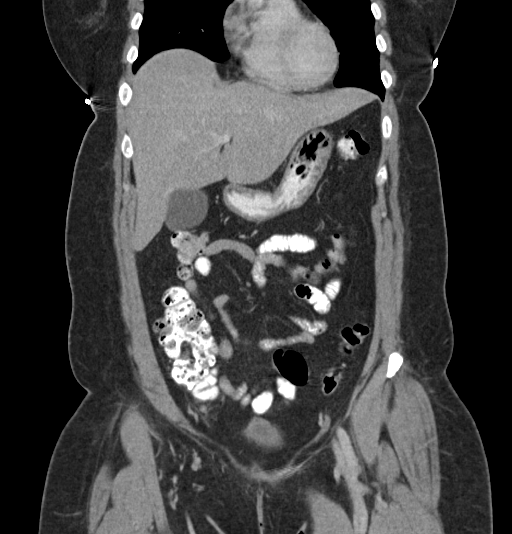
[im 68/152  soft-tissue]
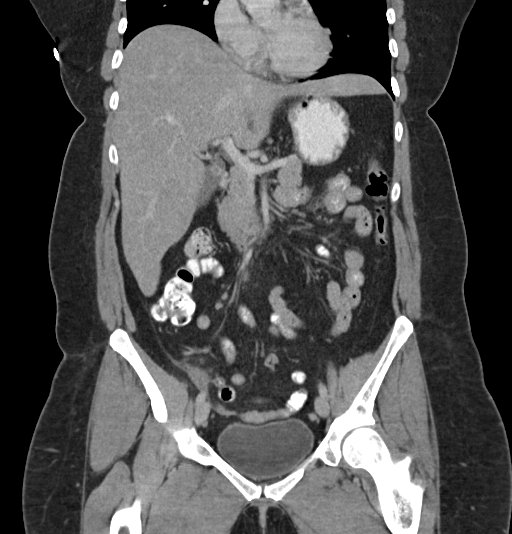
[im 84/152  soft-tissue]
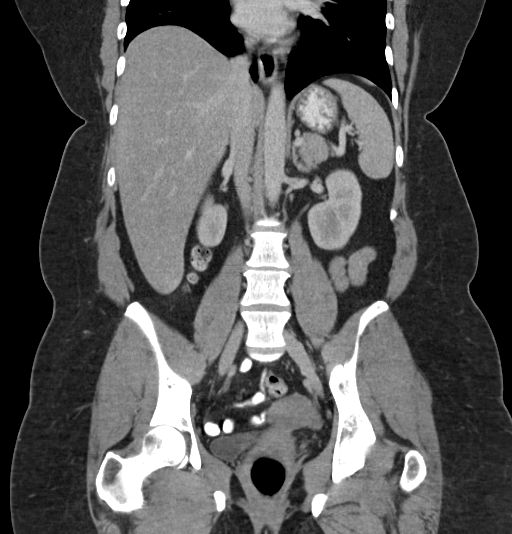

[16 of 46 positions shown; findings below may reference images not displayed]

FINDINGS: Lower chest: Minimal right basilar atelectasis is noted. The
visualized lung bases are otherwise clear. The visualized portions
of the mediastinum are unremarkable.

Hepatobiliary: The liver is unremarkable in appearance. A stone is
noted within the gallbladder. The gallbladder is otherwise
unremarkable. The common bile duct remains normal in caliber.

Pancreas: The pancreas is within normal limits.

Spleen: The spleen is unremarkable in appearance.

Adrenals/Urinary Tract: The adrenal glands are unremarkable in
appearance. The kidneys are within normal limits. There is no
evidence of hydronephrosis. No renal or ureteral stones are
identified. No perinephric stranding is seen.

Stomach/Bowel: There is dilatation of the appendix to 1.2 cm in
maximal diameter, with mild surrounding soft tissue inflammation and
trace fluid. Appendicoliths are noted within the appendix, measuring
up to 0.9 cm in size.

There is no evidence of perforation or abscess formation at this
time. Contrast progresses to the level of the splenic flexure of the
colon. The colon is grossly unremarkable in appearance.

Visualized small bowel loops are grossly unremarkable. The stomach
is unremarkable in appearance.

Vascular/Lymphatic: The abdominal aorta is unremarkable in
appearance. The inferior vena cava is grossly unremarkable. No
retroperitoneal lymphadenopathy is seen. No pelvic sidewall
lymphadenopathy is identified.

Reproductive: The bladder is mildly distended and within normal
limits. The uterus is grossly unremarkable in appearance. The
ovaries are relatively symmetric. No suspicious adnexal masses are
seen.

Other: No additional soft tissue abnormalities are seen.

Musculoskeletal: No acute osseous abnormalities are identified. The
visualized musculature is unremarkable in appearance.
IMPRESSION: 1. Acute appendicitis, with dilatation of the appendix to 1.2 cm in
maximal diameter. Mild surrounding soft tissue inflammation and
trace fluid. Appendicoliths noted within the appendix, measuring up
to 0.9 cm in size.
2. Cholelithiasis.  Gallbladder otherwise unremarkable.
These results were called by telephone at the time of interpretation
on 02/13/2016 at [DATE] to Dr. JUAN MANUEL SUN, who verbally
acknowledged these results.

## 2018-09-02 DIAGNOSIS — Z83511 Family history of glaucoma: Secondary | ICD-10-CM | POA: Diagnosis not present

## 2018-09-02 DIAGNOSIS — H43393 Other vitreous opacities, bilateral: Secondary | ICD-10-CM | POA: Diagnosis not present

## 2018-09-02 DIAGNOSIS — H538 Other visual disturbances: Secondary | ICD-10-CM | POA: Diagnosis not present

## 2018-09-02 DIAGNOSIS — H52203 Unspecified astigmatism, bilateral: Secondary | ICD-10-CM | POA: Diagnosis not present

## 2018-09-02 DIAGNOSIS — H524 Presbyopia: Secondary | ICD-10-CM | POA: Diagnosis not present

## 2018-09-02 DIAGNOSIS — H5213 Myopia, bilateral: Secondary | ICD-10-CM | POA: Diagnosis not present

## 2018-09-02 DIAGNOSIS — H16223 Keratoconjunctivitis sicca, not specified as Sjogren's, bilateral: Secondary | ICD-10-CM | POA: Diagnosis not present

## 2018-10-28 ENCOUNTER — Telehealth: Payer: Self-pay

## 2018-10-28 ENCOUNTER — Encounter: Payer: Self-pay | Admitting: Family Medicine

## 2018-10-28 NOTE — Telephone Encounter (Signed)
Copied from [place] [id number]. Topic: General - Other >> Oct 28, 2018  8:46 AM Leward Quan A wrote: Reason for CRM: Patient called to say that she would like to get the shingles vaccine and need to know how to go about getting it. She is asking for a call back with information or an answer via My Chart please. Ph#  [phone]

## 2018-12-08 ENCOUNTER — Ambulatory Visit (INDEPENDENT_AMBULATORY_CARE_PROVIDER_SITE_OTHER)
Admission: RE | Admit: 2018-12-08 | Discharge: 2018-12-08 | Disposition: A | Discharge status: Home / Self Care | Payer: BC Managed Care – PPO | Source: Ambulatory Visit

## 2018-12-08 ENCOUNTER — Other Ambulatory Visit: Payer: Self-pay | Admitting: *Deleted

## 2018-12-08 DIAGNOSIS — J029 Acute pharyngitis, unspecified: Secondary | ICD-10-CM | POA: Diagnosis not present

## 2018-12-08 DIAGNOSIS — R1110 Vomiting, unspecified: Secondary | ICD-10-CM

## 2018-12-08 DIAGNOSIS — J09X2 Influenza due to identified novel influenza A virus with other respiratory manifestations: Secondary | ICD-10-CM | POA: Diagnosis not present

## 2018-12-08 DIAGNOSIS — M791 Myalgia, unspecified site: Secondary | ICD-10-CM

## 2018-12-08 DIAGNOSIS — R6883 Chills (without fever): Secondary | ICD-10-CM

## 2018-12-08 DIAGNOSIS — R51 Headache: Secondary | ICD-10-CM | POA: Diagnosis not present

## 2018-12-08 DIAGNOSIS — B349 Viral infection, unspecified: Secondary | ICD-10-CM | POA: Diagnosis not present

## 2018-12-08 DIAGNOSIS — Z20828 Contact with and (suspected) exposure to other viral communicable diseases: Secondary | ICD-10-CM

## 2018-12-08 NOTE — ED Provider Notes (Signed)
Virtual Visit via Video Note:  [name]  initiated request for Telemedicine visit with [facility] team. I connected with Ihor Gully  on 12/08/2018 at 9:05 AM  for a synchronized telemedicine visit using a video enabled HIPPA compliant telemedicine application. I verified that I am speaking with Ihor Gully  using two identifiers. Orvan July, NP  was physically located in a [facility] Urgent care site and Nixon Sparr was located at a different location.   The limitations of evaluation and management by telemedicine as well as the availability of in-person appointments were discussed. Patient was informed that she  may incur a bill ( including co-pay) for this virtual visit encounter. Matika Bartell  expressed understanding and gave verbal consent to proceed with virtual visit.     History of Present Illness:Monique Rivers  is a 53 y.o. female presents with vomiting, sore throat, head pressure, body aches, chills. Symptoms have been constant, waxing and waning since Monday. No recorded fever. Concerned for COVID. Taking Zyrtec and Flonase with some relief. No chest pain, SOB.   No past medical history on file.  Allergies  Allergen Reactions  . Amoxicillin Nausea And Vomiting    Has patient had a PCN reaction causing immediate rash, facial/tongue/throat swelling, SOB or lightheadedness with hypotension: No Has patient had a PCN reaction causing severe rash involving mucus membranes or skin necrosis: No Has patient had a PCN reaction that required hospitalization: No Has patient had a PCN reaction occurring within the last 10 years: No If all of the above answers are "NO", then may proceed with Cephalosporin use.   Marland Kitchen Hydrocodone-Acetaminophen Other (See Comments)    "made me crazy"  . [place]  [Triamcinolone] Rash        Observations/Objective:VITALS: Per patient if applicable, see vitals. GENERAL: Alert, appears well and in no acute distress. HEENT: Atraumatic, conjunctiva  clear, no obvious abnormalities on inspection of external nose and ears. NECK: Normal movements of the head and neck. CARDIOPULMONARY: No increased WOB. Speaking in clear sentences. I:E ratio WNL.  MS: Moves all visible extremities without noticeable abnormality. PSYCH: Pleasant and cooperative, well-groomed. Speech normal rate and rhythm. Affect is appropriate. Insight and judgement are appropriate. Attention is focused, linear, and appropriate.  NEURO: CN grossly intact. Oriented as arrived to appointment on time with no prompting. Moves both UE equally.  SKIN: No obvious lesions, wounds, erythema, or cyanosis noted on face or hands.     Assessment and Plan: sending for COVID testing based on symptoms.  Labs pending. Most likely allergies or URI.    Follow Up Instructions: Follow up as needed for continued or worsening symptoms     I discussed the assessment and treatment plan with the patient. The patient was provided an opportunity to ask questions and all were answered. The patient agreed with the plan and demonstrated an understanding of the instructions.   The patient was advised to call back or seek an in-person evaluation if the symptoms worsen or if the condition fails to improve as anticipated.      Orvan July, NP  12/08/2018 9:05 AM         Orvan July, NP 12/10/18 [id number]

## 2018-12-08 NOTE — Discharge Instructions (Signed)
We will send you for COVID testing based on your symptoms.  You can check your my chart for results or we will call if positive.  Follow up as needed for continued or worsening symptoms

## 2018-12-09 LAB — NOVEL CORONAVIRUS, NAA: SARS-CoV-2, NAA: NOT DETECTED

## 2018-12-17 ENCOUNTER — Other Ambulatory Visit: Payer: Self-pay

## 2018-12-17 ENCOUNTER — Ambulatory Visit (INDEPENDENT_AMBULATORY_CARE_PROVIDER_SITE_OTHER): Payer: BC Managed Care – PPO | Admitting: *Deleted

## 2018-12-17 DIAGNOSIS — Z23 Encounter for immunization: Secondary | ICD-10-CM | POA: Diagnosis not present

## 2018-12-17 NOTE — Progress Notes (Signed)
Patient here today for first shingles vaccine and flu shot.  Vaccines given and patient tolerated well.

## 2018-12-28 DIAGNOSIS — K219 Gastro-esophageal reflux disease without esophagitis: Secondary | ICD-10-CM | POA: Diagnosis not present

## 2018-12-28 DIAGNOSIS — Z8371 Family history of colonic polyps: Secondary | ICD-10-CM | POA: Diagnosis not present

## 2018-12-28 DIAGNOSIS — K200 Eosinophilic esophagitis: Secondary | ICD-10-CM | POA: Diagnosis not present

## 2019-02-02 DIAGNOSIS — L814 Other melanin hyperpigmentation: Secondary | ICD-10-CM | POA: Diagnosis not present

## 2019-02-02 DIAGNOSIS — D225 Melanocytic nevi of trunk: Secondary | ICD-10-CM | POA: Diagnosis not present

## 2019-02-02 DIAGNOSIS — Z86018 Personal history of other benign neoplasm: Secondary | ICD-10-CM | POA: Diagnosis not present

## 2019-02-02 DIAGNOSIS — L309 Dermatitis, unspecified: Secondary | ICD-10-CM | POA: Diagnosis not present

## 2019-02-02 DIAGNOSIS — D2262 Melanocytic nevi of left upper limb, including shoulder: Secondary | ICD-10-CM | POA: Diagnosis not present

## 2019-02-02 DIAGNOSIS — D485 Neoplasm of uncertain behavior of skin: Secondary | ICD-10-CM | POA: Diagnosis not present

## 2019-02-07 NOTE — Patient Instructions (Addendum)
It was great to see you again today, I will be in touch with your labs ASAP I will request your colonoscopy report for our records here  Take care!  Keep up the good work    Health Maintenance, Female Adopting a healthy lifestyle and getting preventive care are important in promoting health and wellness. Ask your health care provider about:  The right schedule for you to have regular tests and exams.  Things you can do on your own to prevent diseases and keep yourself healthy. What should I know about diet, weight, and exercise? Eat a healthy diet   Eat a diet that includes plenty of vegetables, fruits, low-fat dairy products, and lean protein.  Do not eat a lot of foods that are high in solid fats, added sugars, or sodium. Maintain a healthy weight Body mass index (BMI) is used to identify weight problems. It estimates body fat based on height and weight. Your health care provider can help determine your BMI and help you achieve or maintain a healthy weight. Get regular exercise Get regular exercise. This is one of the most important things you can do for your health. Most adults should:  Exercise for at least 150 minutes each week. The exercise should increase your heart rate and make you sweat (moderate-intensity exercise).  Do strengthening exercises at least twice a week. This is in addition to the moderate-intensity exercise.  Spend less time sitting. Even light physical activity can be beneficial. Watch cholesterol and blood lipids Have your blood tested for lipids and cholesterol at 51 years of age, then have this test every 5 years. Have your cholesterol levels checked more often if:  Your lipid or cholesterol levels are high.  You are older than 51 years of age.  You are at high risk for heart disease. What should I know about cancer screening? Depending on your health history and family history, you may need to have cancer screening at various ages. This may include  screening for:  Breast cancer.  Cervical cancer.  Colorectal cancer.  Skin cancer.  Lung cancer. What should I know about heart disease, diabetes, and high blood pressure? Blood pressure and heart disease  High blood pressure causes heart disease and increases the risk of stroke. This is more likely to develop in people who have high blood pressure readings, are of African descent, or are overweight.  Have your blood pressure checked: ? Every 3-5 years if you are 45-51 years of age. ? Every year if you are 51 years old or older. Diabetes Have regular diabetes screenings. This checks your fasting blood sugar level. Have the screening done:  Once every three years after age 51 if you are at a normal weight and have a low risk for diabetes.  More often and at a younger age if you are overweight or have a high risk for diabetes. What should I know about preventing infection? Hepatitis B If you have a higher risk for hepatitis B, you should be screened for this virus. Talk with your health care provider to find out if you are at risk for hepatitis B infection. Hepatitis C Testing is recommended for:  Everyone born from [birth year] through 1963.  Anyone with known risk factors for hepatitis C. Sexually transmitted infections (STIs)  Get screened for STIs, including gonorrhea and chlamydia, if: ? You are sexually active and are younger than 51 years of age. ? You are older than 51 years of age and your health care provider  tells you that you are at risk for this type of infection. ? Your sexual activity has changed since you were last screened, and you are at increased risk for chlamydia or gonorrhea. Ask your health care provider if you are at risk.  Ask your health care provider about whether you are at high risk for HIV. Your health care provider may recommend a prescription medicine to help prevent HIV infection. If you choose to take medicine to prevent HIV, you should first get  tested for HIV. You should then be tested every 3 months for as long as you are taking the medicine. Pregnancy  If you are about to stop having your period (premenopausal) and you may become pregnant, seek counseling before you get pregnant.  Take 400 to 800 micrograms (mcg) of folic acid every day if you become pregnant.  Ask for birth control (contraception) if you want to prevent pregnancy. Osteoporosis and menopause Osteoporosis is a disease in which the bones lose minerals and strength with aging. This can result in bone fractures. If you are 51 years old or older, or if you are at risk for osteoporosis and fractures, ask your health care provider if you should:  Be screened for bone loss.  Take a calcium or vitamin D supplement to lower your risk of fractures.  Be given hormone replacement therapy (HRT) to treat symptoms of menopause. Follow these instructions at home: Lifestyle  Do not use any products that contain nicotine or tobacco, such as cigarettes, e-cigarettes, and chewing tobacco. If you need help quitting, ask your health care provider.  Do not use street drugs.  Do not share needles.  Ask your health care provider for help if you need support or information about quitting drugs. Alcohol use  Do not drink alcohol if: ? Your health care provider tells you not to drink. ? You are pregnant, may be pregnant, or are planning to become pregnant.  If you drink alcohol: ? Limit how much you use to 0-1 drink a day. ? Limit intake if you are breastfeeding.  Be aware of how much alcohol is in your drink. In the [place] one drink equals one 12 oz bottle of beer (355 mL), one 5 oz glass of wine (148 mL), or one 1 oz glass of hard liquor (44 mL). General instructions  Schedule regular health, dental, and eye exams.  Stay current with your vaccines.  Tell your health care provider if: ? You often feel depressed. ? You have ever been abused or do not feel safe at  home. Summary  Adopting a healthy lifestyle and getting preventive care are important in promoting health and wellness.  Follow your health care provider's instructions about healthy diet, exercising, and getting tested or screened for diseases.  Follow your health care provider's instructions on monitoring your cholesterol and blood pressure. This information is not intended to replace advice given to you by your health care provider. Make sure you discuss any questions you have with your health care provider. Document Released: 10/14/2010 Document Revised: 03/24/2018 Document Reviewed: 03/24/2018 Elsevier Patient Education  2020 [employer].

## 2019-02-07 NOTE — Progress Notes (Addendum)
[facility] [address] [place], [place] [address] [phone]  Date:  02/14/2019   Name:  Monique Rivers   DOB:  Oct 29, 1965   MRN:  [id number]  PCP:  Darreld Mclean, MD    Chief Complaint: Annual Exam   History of Present Illness:  Monique Rivers is a 53 y.o. very pleasant female patient who presents with the following:  Generally healthy woman with history of appendicitis and appendectomy in 2017 Here today for physical exam I saw her most recently to establish care about 1 year ago She has a gynecologist, Dr Hermelinda Medicus with [facility]  She is originally from [place], is single with no children. 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  Request report today Can suggest Shingrix- already had first dose ine September, second dose is scheduled  Overall Monique Rivers is feeling well, has no particular concerns today Menses are normal No chest pain or shortness of breath with exercise She has lost a few pounds intentionally  Patient Active Problem List   Diagnosis Date Noted  . Acute appendicitis 02/13/2016    History reviewed. No pertinent past medical history.  Past Surgical History:  Procedure Laterality Date  . LAPAROSCOPIC APPENDECTOMY N/A 02/14/2016   Procedure: APPENDECTOMY LAPAROSCOPIC;  Surgeon: Jackolyn Confer, MD;  Location: [facility];  Service: General;  Laterality: N/A;    Social History   Tobacco Use  . Smoking status: Never Smoker  . Smokeless tobacco: Never  Used  Substance Use Topics  . Alcohol use: Yes    Comment: occasionally  . Drug use: Never    Family History  Problem Relation Age of Onset  . Diabetes Mother   . Hearing loss Mother   . Early death Mother   . Hypertension Father   . Hyperlipidemia Father   . Stroke Father   . Miscarriages / Stillbirths Sister   . Hearing loss Maternal Grandmother   . Hyperlipidemia Maternal Grandmother   . Hypertension Maternal Grandmother   . Heart disease Maternal Grandmother   . Stroke Maternal Grandmother   . Heart attack Maternal Grandfather   . Early death Maternal Grandfather   . Early death Paternal Grandmother   . Cancer Paternal Grandmother   . Early death Paternal Grandfather   . Heart attack Paternal Grandfather     Allergies  Allergen Reactions  . Amoxicillin Nausea And Vomiting    Has patient had a PCN reaction causing immediate rash, facial/tongue/throat swelling, SOB or lightheadedness with hypotension: No Has patient had a PCN reaction causing severe rash involving mucus membranes or skin necrosis: No Has patient had a PCN reaction that required hospitalization: No Has patient had a PCN reaction occurring within the last 10 years: No If all of the above answers are "NO", then may proceed with Cephalosporin use.   Marland Kitchen Hydrocodone-Acetaminophen Other (See Comments)    "made me crazy"  . Penicillins   . [place]  [Triamcinolone] Rash  Medication list has been reviewed and updated.  Current Outpatient Medications on File Prior to Visit  Medication Sig Dispense Refill  . CALCIUM PO Take 1 tablet by mouth daily.    . cetirizine (ZYRTEC) 10 MG tablet Take 10 mg by mouth daily.    . fluticasone (FLONASE) 50 MCG/ACT nasal spray Place 1 spray into both nostrils daily.    Devonne Doughty. LORYNA 3-0.02 MG tablet Take 1 tablet by mouth daily.    . Multiple Vitamin (MULTIVITAMIN WITH MINERALS) TABS tablet Take 1 tablet by mouth daily.    . naphazoline (NAPHCON) 0.1 % ophthalmic solution Place  1 drop into both eyes daily as needed.    . pantoprazole (PROTONIX) 40 MG tablet Take 40 mg by mouth daily.     No current facility-administered medications on file prior to visit.     Review of Systems:  As per HPI- otherwise negative.   Physical Examination: Vitals:   02/14/19 0936  BP: 132/80  Pulse: 79  Resp: 16  Temp: (!) 96.6 F (35.9 C)  SpO2: 98%   Vitals:   02/14/19 0936  Weight: 188 lb (85.3 kg)  Height: 5\' 3"  (1.6 m)   Body mass index is 33.3 kg/m. Ideal Body Weight: Weight in (lb) to have BMI = 25: 140.8  GEN: WDWN, NAD, Non-toxic, A & O x 3, overweight, looks well  HEENT: Atraumatic, Normocephalic. Neck supple. No masses, No LAD.  Bilateral TM wnl, oropharynx normal.  PEERL,EOMI.   Ears and Nose: No external deformity. CV: RRR, No M/G/R. No JVD. No thrill. No extra heart sounds. PULM: CTA B, no wheezes, crackles, rhonchi. No retractions. No resp. distress. No accessory muscle use. ABD: S, NT, ND, +BS. No rebound. No HSM. EXTR: No c/c/e NEURO Normal gait.  PSYCH: Normally interactive. Conversant. Not depressed or anxious appearing.  Calm demeanor.   Wt Readings from Last 3 Encounters:  02/14/19 188 lb (85.3 kg)  02/10/18 193 lb (87.5 kg)   Her menses are regular  Assessment and Plan: Physical exam  Screening for hyperlipidemia - Plan: Lipid panel  Screening for deficiency anemia - Plan: CBC  Screening for diabetes mellitus - Plan: Comprehensive metabolic panel, Hemoglobin A1c  Screening for HIV (human immunodeficiency virus) - Plan: HIV Antibody (routine testing w rflx)  Screening for thyroid disorder - Plan: TSH  Following up today for complete physical Weight is stable, she has lost a few pounds Continue good exercise habits Labs pending as above I have requested her most recent colonoscopy report Will plan further follow- up pending labs.   Signed Abbe AmsterdamJessica Orie Cuttino, MD  Received her labs, message to patient  Results for orders  placed or performed in visit on 02/14/19  CBC  Result Value Ref Range   WBC 10.7 (H) 4.0 - 10.5 K/uL   RBC 4.55 3.87 - 5.11 Mil/uL   Platelets 344.0 150.0 - 400.0 K/uL   Hemoglobin 13.0 12.0 - 15.0 g/dL   HCT 16.138.8 09.636.0 - 04.546.0 %   MCV 85.2 78.0 - 100.0 fl   MCHC 33.5 30.0 - 36.0 g/dL   RDW 40.913.6 81.111.5 - 91.415.5 %  Comprehensive metabolic panel  Result Value Ref Range   Sodium 136 135 - 145 mEq/L   Potassium 4.3 3.5 - 5.1 mEq/L   Chloride 103 96 - 112 mEq/L   CO2 23 19 - 32 mEq/L   Glucose, Bld 82 70 - 99 mg/dL   BUN 7 6 - 23 mg/dL   Creatinine, Ser 7.820.66 0.40 -  1.20 mg/dL   Total Bilirubin 0.3 0.2 - 1.2 mg/dL   Alkaline Phosphatase 107 39 - 117 U/L   AST 14 0 - 37 U/L   ALT 15 0 - 35 U/L   Total Protein 6.7 6.0 - 8.3 g/dL   Albumin 4.0 3.5 - 5.2 g/dL   Calcium 9.1 8.4 - 72.0 mg/dL   GFR 94.70 >96.28 mL/min  Hemoglobin A1c  Result Value Ref Range   Hgb A1c MFr Bld 5.7 4.6 - 6.5 %  Lipid panel  Result Value Ref Range   Cholesterol 216 (H) 0 - 200 mg/dL   Triglycerides 366.2 (H) 0.0 - 149.0 mg/dL   HDL 94.76 >54.65 mg/dL   VLDL 03.5 0.0 - 46.5 mg/dL   LDL Cholesterol 681 (H) 0 - 99 mg/dL   Total CHOL/HDL Ratio 3    NonHDL 139.65   TSH  Result Value Ref Range   TSH 1.44 0.35 - 4.50 uIU/mL

## 2019-02-14 ENCOUNTER — Ambulatory Visit (INDEPENDENT_AMBULATORY_CARE_PROVIDER_SITE_OTHER): Payer: BC Managed Care – PPO | Admitting: Family Medicine

## 2019-02-14 ENCOUNTER — Other Ambulatory Visit: Payer: Self-pay

## 2019-02-14 ENCOUNTER — Encounter: Payer: Self-pay | Admitting: Family Medicine

## 2019-02-14 VITALS — BP 132/80 | HR 79 | Temp 96.6°F | Resp 16 | Ht 63.0 in | Wt 188.0 lb

## 2019-02-14 DIAGNOSIS — R7303 Prediabetes: Secondary | ICD-10-CM | POA: Insufficient documentation

## 2019-02-14 DIAGNOSIS — Z114 Encounter for screening for human immunodeficiency virus [HIV]: Secondary | ICD-10-CM | POA: Diagnosis not present

## 2019-02-14 DIAGNOSIS — Z13220 Encounter for screening for lipoid disorders: Secondary | ICD-10-CM

## 2019-02-14 DIAGNOSIS — Z130 Encounter for screening for diseases of the blood and blood-forming organs and certain disorders involving the immune mechanism: Secondary | ICD-10-CM | POA: Diagnosis not present

## 2019-02-14 DIAGNOSIS — Z0000 Encounter for general adult medical examination without abnormal findings: Secondary | ICD-10-CM

## 2019-02-14 DIAGNOSIS — Z1329 Encounter for screening for other suspected endocrine disorder: Secondary | ICD-10-CM

## 2019-02-14 DIAGNOSIS — Z131 Encounter for screening for diabetes mellitus: Secondary | ICD-10-CM | POA: Diagnosis not present

## 2019-02-14 LAB — LIPID PANEL
Cholesterol: 216 mg/dL — ABNORMAL HIGH (ref 0–200)
HDL: 76.7 mg/dL (ref >39.00)
LDL Cholesterol: 105 mg/dL — ABNORMAL HIGH (ref 0–99)
NonHDL: 139.65
Total CHOL/HDL Ratio: 3
Triglycerides: 173 mg/dL — ABNORMAL HIGH (ref 0.0–149.0)
VLDL: 34.6 mg/dL (ref 0.0–40.0)

## 2019-02-14 LAB — CBC
HCT: 38.8 % (ref 36.0–46.0)
Hemoglobin: 13 g/dL (ref 12.0–15.0)
MCHC: 33.5 g/dL (ref 30.0–36.0)
MCV: 85.2 fl (ref 78.0–100.0)
Platelets: 344 10*3/uL (ref 150.0–400.0)
RBC: 4.55 Mil/uL (ref 3.87–5.11)
RDW: 13.6 % (ref 11.5–15.5)
WBC: 10.7 10*3/uL — ABNORMAL HIGH (ref 4.0–10.5)

## 2019-02-14 LAB — COMPREHENSIVE METABOLIC PANEL
ALT: 15 U/L (ref 0–35)
AST: 14 U/L (ref 0–37)
Albumin: 4 g/dL (ref 3.5–5.2)
Alkaline Phosphatase: 107 U/L (ref 39–117)
BUN: 7 mg/dL (ref 6–23)
CO2: 23 mEq/L (ref 19–32)
Calcium: 9.1 mg/dL (ref 8.4–10.5)
Chloride: 103 mEq/L (ref 96–112)
Creatinine, Ser: 0.66 mg/dL (ref 0.40–1.20)
GFR: 94.43 mL/min (ref >60.00)
Glucose, Bld: 82 mg/dL (ref 70–99)
Potassium: 4.3 mEq/L (ref 3.5–5.1)
Sodium: 136 mEq/L (ref 135–145)
Total Bilirubin: 0.3 mg/dL (ref 0.2–1.2)
Total Protein: 6.7 g/dL (ref 6.0–8.3)

## 2019-02-14 LAB — TSH: TSH: 1.44 u[IU]/mL (ref 0.35–4.50)

## 2019-02-14 LAB — HEMOGLOBIN A1C: Hgb A1c MFr Bld: 5.7 % (ref 4.6–6.5)

## 2019-02-15 LAB — HIV ANTIBODY (ROUTINE TESTING W REFLEX): HIV 1&2 Ab, 4th Generation: NONREACTIVE

## 2019-03-04 ENCOUNTER — Other Ambulatory Visit: Payer: Self-pay

## 2019-03-04 ENCOUNTER — Ambulatory Visit (INDEPENDENT_AMBULATORY_CARE_PROVIDER_SITE_OTHER): Payer: BC Managed Care – PPO | Admitting: *Deleted

## 2019-03-04 DIAGNOSIS — Z23 Encounter for immunization: Secondary | ICD-10-CM | POA: Diagnosis not present

## 2019-03-04 NOTE — Progress Notes (Signed)
Patient here for second shingles vaccine.  Vaccine given in left deltoid and tolerated well. 

## 2019-05-26 DIAGNOSIS — Z23 Encounter for immunization: Secondary | ICD-10-CM | POA: Diagnosis not present

## 2019-05-26 DIAGNOSIS — H02826 Cysts of left eye, unspecified eyelid: Secondary | ICD-10-CM | POA: Diagnosis not present

## 2019-05-26 DIAGNOSIS — L821 Other seborrheic keratosis: Secondary | ICD-10-CM | POA: Diagnosis not present

## 2019-06-01 DIAGNOSIS — Z01419 Encounter for gynecological examination (general) (routine) without abnormal findings: Secondary | ICD-10-CM | POA: Diagnosis not present

## 2019-06-01 DIAGNOSIS — Z3041 Encounter for surveillance of contraceptive pills: Secondary | ICD-10-CM | POA: Diagnosis not present

## 2019-06-21 DIAGNOSIS — Z1231 Encounter for screening mammogram for malignant neoplasm of breast: Secondary | ICD-10-CM | POA: Diagnosis not present

## 2019-08-09 ENCOUNTER — Encounter: Payer: Self-pay | Admitting: Family Medicine

## 2019-08-09 DIAGNOSIS — M79671 Pain in right foot: Secondary | ICD-10-CM | POA: Diagnosis not present

## 2019-08-09 DIAGNOSIS — D1631 Benign neoplasm of short bones of right lower limb: Secondary | ICD-10-CM | POA: Diagnosis not present

## 2019-08-09 DIAGNOSIS — M722 Plantar fascial fibromatosis: Secondary | ICD-10-CM | POA: Diagnosis not present

## 2019-08-09 DIAGNOSIS — G8929 Other chronic pain: Secondary | ICD-10-CM | POA: Diagnosis not present

## 2019-08-15 NOTE — Patient Instructions (Signed)
It was great to see you again today, I will be in touch to your labs as soon as possible Assuming is all is okay, we can plan to visit in 6 months for your physical

## 2019-08-15 NOTE — Progress Notes (Addendum)
[place] at [facility] [place], [place], [place] [address] [phone]  Date:  08/17/2019   Name:  Monique Rivers   DOB:  March 28, 1966   MRN:  [id number]  PCP:  Darreld Mclean, MD    Chief Complaint: 6 month follow up   History of Present Illness:  Monique Rivers is a 54 y.o. 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   Enjoys church activities and exercise  She has GYN care through [facility] At last labs, her white blood cell count was slightly elevated and A1c was consistent with prediabetes.  Would like to repeat these today  COVID-19 vaccine complete shingrix is done as well  Mammogram done June 24, 2019 Colon cancer screening up-to-date  She is walking a lot for exercise She continues to gradually lose weight.  Generally feeling well, no particular concerns today Wt Readings from Last 3 Encounters:  08/17/19 181 lb (82.1 kg)  02/14/19 188 lb (85.3 kg)  02/10/18 193 lb (87.5 kg)    Patient Active Problem List   Diagnosis Date Noted  . Prediabetes 02/14/2019  . Acute appendicitis 02/13/2016    History reviewed. No pertinent past medical history.  Past Surgical History:  Procedure Laterality Date  . LAPAROSCOPIC APPENDECTOMY N/A 02/14/2016   Procedure: APPENDECTOMY LAPAROSCOPIC;  Surgeon: Jackolyn Confer, MD;  Location: [facility];  Service: General;  Laterality: N/A;    Social History   Tobacco Use  . Smoking status: Never Smoker  . Smokeless tobacco: Never Used  Substance Use Topics  . Alcohol use: Yes    Comment: occasionally  . Drug use: Never    Family History  Problem Relation  Age of Onset  . Diabetes Mother   . Hearing loss Mother   . Early death Mother   . Hypertension Father   . Hyperlipidemia Father   . Stroke Father   . Miscarriages / Stillbirths Sister   . Hearing loss Maternal Grandmother   . Hyperlipidemia Maternal Grandmother   . Hypertension Maternal Grandmother   . Heart disease Maternal Grandmother   . Stroke Maternal Grandmother   . Heart attack Maternal Grandfather   . Early death Maternal Grandfather   . Early death Paternal Grandmother   . Cancer Paternal Grandmother   . Early death Paternal Grandfather   . Heart attack Paternal Grandfather   . Early death Paternal 5   . Leukemia Paternal Aunt     Allergies  Allergen Reactions  . Amoxicillin Nausea And Vomiting    Has patient had a PCN reaction causing immediate rash, facial/tongue/throat swelling, SOB or lightheadedness with hypotension: No Has patient had a PCN reaction causing severe rash involving mucus membranes or skin necrosis: No Has patient had a PCN reaction that required hospitalization: No Has patient had a PCN reaction occurring within the last 10 years: No If all of the above answers are "NO", then may proceed with Cephalosporin use.   Marland Kitchen Hydrocodone-Acetaminophen Other (See Comments)    "made me crazy"  . Penicillins   . [place]  [Triamcinolone] Rash    Medication list has been reviewed  and updated.  Current Outpatient Medications on File Prior to Visit  Medication Sig Dispense Refill  . CALCIUM PO Take 1 tablet by mouth daily.    . cetirizine (ZYRTEC) 10 MG tablet Take 10 mg by mouth daily.    . fluticasone (FLONASE) 50 MCG/ACT nasal spray Place 1 spray into both nostrils daily.    Devonne Doughty 3-0.02 MG tablet Take 1 tablet by mouth daily.    . Multiple Vitamin (MULTIVITAMIN WITH MINERALS) TABS tablet Take 1 tablet by mouth daily.    . naphazoline (NAPHCON) 0.1 % ophthalmic solution Place 1 drop into both eyes daily as needed.    . pantoprazole (PROTONIX) 40 MG  tablet Take 40 mg by mouth daily.     No current facility-administered medications on file prior to visit.    Review of Systems:  As per HPI- otherwise negative. Her allergies have been under ok control No chest pain or shortness of breath with exercise   Physical Examination: Vitals:   08/17/19 0820  BP: 124/86  Pulse: 81  Resp: 16  SpO2: 99%   Vitals:   08/17/19 0820  Weight: 181 lb (82.1 kg)  Height: 5\' 3"  (1.6 m)   Body mass index is 32.06 kg/m. Ideal Body Weight: Weight in (lb) to have BMI = 25: 140.8  GEN: no acute distress.    Overweight, looks well  HEENT: Atraumatic, Normocephalic.  Ears and Nose: No external deformity. CV: RRR, No M/G/R. No JVD. No thrill. No extra heart sounds. PULM: CTA B, no wheezes, crackles, rhonchi. No retractions. No resp. distress. No accessory muscle use. ABD: S, NT, ND, +BS. No rebound. No HSM. EXTR: No c/c/e PSYCH: Normally interactive. Conversant.    Assessment and Plan: Leukocytosis, unspecified type - Plan: CBC  Prediabetes - Plan: Hemoglobin A1c  Class 1 obesity due to excess calories without serious comorbidity with body mass index (BMI) of 32.0 to 32.9 in adult  Here today for a follow-up visit.  She had mild leukocytosis at her last labs, A1c in the prediabetes range.  We will check this for her today.  She continues to lose weight gradually through diet and exercise.  Praised and encouraged her efforts  Will plan further follow- up pending labs. Moderate medical decision making today This visit occurred during the SARS-CoV-2 public health emergency.  Safety protocols were in place, including screening questions prior to the visit, additional usage of staff PPE, and extensive cleaning of exam room while observing appropriate contact time as indicated for disinfecting solutions.     Signed , MD  Received her labs as below, message to pt  Results for orders placed or performed in visit on 08/17/19   CBC  Result Value Ref Range   WBC 9.4 4.0 - 10.5 K/uL   RBC 4.67 3.87 - 5.11 Mil/uL   Platelets 371.0 150.0 - 400.0 K/uL   Hemoglobin 13.4 12.0 - 15.0 g/dL   HCT 10/17/19 30.8 - 65.7 %   MCV 85.7 78.0 - 100.0 fl   MCHC 33.6 30.0 - 36.0 g/dL   RDW 84.6 96.2 - 95.2 %  Hemoglobin A1c  Result Value Ref Range   Hgb A1c MFr Bld 5.6 4.6 - 6.5 %

## 2019-08-17 ENCOUNTER — Encounter: Payer: Self-pay | Admitting: Family Medicine

## 2019-08-17 ENCOUNTER — Ambulatory Visit (INDEPENDENT_AMBULATORY_CARE_PROVIDER_SITE_OTHER): Payer: BC Managed Care – PPO | Admitting: Family Medicine

## 2019-08-17 ENCOUNTER — Other Ambulatory Visit: Payer: Self-pay

## 2019-08-17 VITALS — BP 124/86 | HR 81 | Resp 16 | Ht 63.0 in | Wt 181.0 lb

## 2019-08-17 DIAGNOSIS — E6609 Other obesity due to excess calories: Secondary | ICD-10-CM | POA: Diagnosis not present

## 2019-08-17 DIAGNOSIS — D72829 Elevated white blood cell count, unspecified: Secondary | ICD-10-CM

## 2019-08-17 DIAGNOSIS — R7303 Prediabetes: Secondary | ICD-10-CM | POA: Diagnosis not present

## 2019-08-17 DIAGNOSIS — Z6832 Body mass index (BMI) 32.0-32.9, adult: Secondary | ICD-10-CM | POA: Diagnosis not present

## 2019-08-17 LAB — CBC
HCT: 40 % (ref 36.0–46.0)
Hemoglobin: 13.4 g/dL (ref 12.0–15.0)
MCHC: 33.6 g/dL (ref 30.0–36.0)
MCV: 85.7 fl (ref 78.0–100.0)
Platelets: 371 10*3/uL (ref 150.0–400.0)
RBC: 4.67 Mil/uL (ref 3.87–5.11)
RDW: 13.2 % (ref 11.5–15.5)
WBC: 9.4 10*3/uL (ref 4.0–10.5)

## 2019-08-17 LAB — HEMOGLOBIN A1C: Hgb A1c MFr Bld: 5.6 % (ref 4.6–6.5)

## 2019-08-30 DIAGNOSIS — G8929 Other chronic pain: Secondary | ICD-10-CM | POA: Diagnosis not present

## 2019-08-30 DIAGNOSIS — M79671 Pain in right foot: Secondary | ICD-10-CM | POA: Diagnosis not present

## 2019-08-30 DIAGNOSIS — M722 Plantar fascial fibromatosis: Secondary | ICD-10-CM | POA: Diagnosis not present

## 2019-09-05 DIAGNOSIS — Z83511 Family history of glaucoma: Secondary | ICD-10-CM | POA: Diagnosis not present

## 2019-09-05 DIAGNOSIS — H43393 Other vitreous opacities, bilateral: Secondary | ICD-10-CM | POA: Diagnosis not present

## 2019-09-05 DIAGNOSIS — H16223 Keratoconjunctivitis sicca, not specified as Sjogren's, bilateral: Secondary | ICD-10-CM | POA: Diagnosis not present

## 2019-10-04 DIAGNOSIS — G8929 Other chronic pain: Secondary | ICD-10-CM | POA: Diagnosis not present

## 2019-10-04 DIAGNOSIS — M79671 Pain in right foot: Secondary | ICD-10-CM | POA: Diagnosis not present

## 2019-10-04 DIAGNOSIS — M722 Plantar fascial fibromatosis: Secondary | ICD-10-CM | POA: Diagnosis not present

## 2019-10-26 DIAGNOSIS — M722 Plantar fascial fibromatosis: Secondary | ICD-10-CM | POA: Diagnosis not present

## 2020-01-19 ENCOUNTER — Ambulatory Visit (INDEPENDENT_AMBULATORY_CARE_PROVIDER_SITE_OTHER): Payer: BC Managed Care – PPO

## 2020-01-19 ENCOUNTER — Other Ambulatory Visit: Payer: Self-pay

## 2020-01-19 DIAGNOSIS — Z23 Encounter for immunization: Secondary | ICD-10-CM

## 2020-02-16 ENCOUNTER — Encounter: Payer: BC Managed Care – PPO | Admitting: Family Medicine

## 2020-03-15 ENCOUNTER — Encounter: Payer: BC Managed Care – PPO | Admitting: Family Medicine

## 2020-03-29 ENCOUNTER — Encounter: Payer: BC Managed Care – PPO | Admitting: Family Medicine

## 2020-04-26 ENCOUNTER — Encounter: Payer: BC Managed Care – PPO | Admitting: Family Medicine

## 2020-06-27 LAB — HM PAP SMEAR: HM Pap smear: NORMAL

## 2020-06-27 LAB — RESULTS CONSOLE HPV: CHL HPV: NEGATIVE

## 2020-07-11 ENCOUNTER — Encounter: Payer: BC Managed Care – PPO | Admitting: Family Medicine

## 2020-07-18 ENCOUNTER — Encounter: Payer: BC Managed Care – PPO | Admitting: Family Medicine

## 2020-09-22 NOTE — Progress Notes (Addendum)
[facility] at [facility] [address], [address] [place], [place] [address] [phone]  Date:  10/01/2020   Name:  Monique Rivers   DOB:  1965/11/09   MRN:  [id number]  PCP:  Pearline Cables, MD    Chief Complaint: Annual Exam (No pap)   History of Present Illness:  Monique Rivers is a 55 y.o. 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  Single, no kids Enjoys walking for exercise  COVID-19 booster- done  Mammogram up-to-date Pap smear- per GYN  And hepatitis C screening with next routine labs Due for lab work today Colon UTD   She did do some steroids shots for PF in her right foot last year- she was seeing podiatry for treatment.  This is finally better but it got in the way of her walking and she gained some weight - also she was working really hard the last few months  She has a plan to get back into her exercise routine   She is still taking her pantoprazole- she has tried not taking it but her sx came back   Wt Readings from Last 3 Encounters:  10/01/20 194 lb (88 kg)  08/17/19 181 lb (82.1 kg)  02/14/19 188 lb (85.3 kg)   Her father was dx with bladder cancer last week He was a smoker   Patient Active Problem List   Diagnosis Date Noted   Prediabetes 02/14/2019   Acute appendicitis 02/13/2016    No past medical history on file.  Past Surgical History:  Procedure Laterality Date   LAPAROSCOPIC APPENDECTOMY N/A 02/14/2016   Procedure: APPENDECTOMY LAPAROSCOPIC;  Surgeon: Avel Peace, MD;  Location: [facility];  Service: General;  Laterality: N/A;    Social History   Tobacco Use   Smoking status: Never   Smokeless tobacco: Never   Vaping Use   Vaping Use: Never used  Substance Use Topics   Alcohol use: Yes    Comment: occasionally   Drug use: Never    Family History  Problem Relation Age of Onset   Diabetes Mother    Hearing loss Mother    Early death Mother    Hypertension Father    Hyperlipidemia Father    Stroke Father    Miscarriages / [place] Sister    Hearing loss Maternal Grandmother    Hyperlipidemia Maternal Grandmother    Hypertension Maternal Grandmother    Heart disease Maternal Grandmother    Stroke Maternal Grandmother    Heart attack Maternal Grandfather    Early death Maternal Grandfather    Early death Paternal Grandmother    Cancer Paternal Grandmother    Early death Paternal Grandfather    Heart attack Paternal Grandfather    Early death Paternal Aunt    Leukemia Paternal Aunt     Allergies  Allergen Reactions   Amoxicillin Nausea And Vomiting    Has patient had a PCN reaction causing immediate rash, facial/tongue/throat swelling, SOB or lightheadedness with hypotension: No Has patient had a PCN reaction causing severe rash involving mucus membranes or skin necrosis: No Has patient had a PCN reaction that required hospitalization: No Has patient had a PCN reaction occurring within  the last 10 years: No If all of the above answers are "NO", then may proceed with Cephalosporin use.    Hydrocodone-Acetaminophen Other (See Comments)    "made me crazy"   Penicillins    [place]  [Triamcinolone] Rash    Medication list has been reviewed and updated.  Current Outpatient Medications on File Prior to Visit  Medication Sig Dispense Refill   CALCIUM PO Take 1 tablet by mouth daily.     cetirizine (ZYRTEC) 10 MG tablet Take 10 mg by mouth daily.     fluticasone (FLONASE) 50 MCG/ACT nasal spray Place 1 spray into both nostrils daily.     LORYNA 3-0.02 MG tablet Take 1 tablet by mouth daily.     Multiple Vitamin (MULTIVITAMIN WITH MINERALS) TABS tablet Take 1 tablet by mouth  daily.     naphazoline (NAPHCON) 0.1 % ophthalmic solution Place 1 drop into both eyes daily as needed.     pantoprazole (PROTONIX) 40 MG tablet Take 40 mg by mouth daily.     No current facility-administered medications on file prior to visit.    Review of Systems:  As per HPI- otherwise negative.   Physical Examination: Vitals:   10/01/20 0848  BP: 132/82  Pulse: 78  Resp: 16  Temp: (!) 97.5 F (36.4 C)  SpO2: 99%   Vitals:   10/01/20 0848  Weight: 194 lb (88 kg)  Height: 5\' 3"  (1.6 m)   Body mass index is 34.37 kg/m. Ideal Body Weight: Weight in (lb) to have BMI = 25: 140.8  GEN: no acute distress.  Obese, looks well  HEENT: Atraumatic, Normocephalic. Bilateral TM wnl, oropharynx normal.  PEERL,EOMI.   Ears and Nose: No external deformity. CV: RRR, No M/G/R. No JVD. No thrill. No extra heart sounds. PULM: CTA B, no wheezes, crackles, rhonchi. No retractions. No resp. distress. No accessory muscle use. ABD: S, NT, ND, +BS. No rebound. No HSM. EXTR: No c/c/e PSYCH: Normally interactive. Conversant.    Assessment and Plan: Physical exam  Prediabetes - Plan: Comprehensive metabolic panel, Hemoglobin A1c  Screening for hyperlipidemia - Plan: Lipid panel  Screening for thyroid disorder - Plan: TSH  Screening for deficiency anemia - Plan: CBC  Encounter for hepatitis C screening test for low risk patient - Plan: Hepatitis C antibody  Fatigue, unspecified type - Plan: TSH, VITAMIN D 25 Hydroxy (Vit-D Deficiency, Fractures)   Physical exam today.  Encouraged healthy diet and exercise routine Will plan further follow- up pending labs. Obesity, she plans to work on increased exercise.  Her exercise has been limited recently due to increased work demands and Planter fasciitis  Up-to-date on all immunizations and screening tests  This visit occurred during the SARS-CoV-2 public health emergency.  Safety protocols were in place, including screening questions prior  to the visit, additional usage of staff PPE, and extensive cleaning of exam room while observing appropriate contact time as indicated for disinfecting solutions.   Signed , MD  Addendum 6/21, received her labs as below.  Message to patient  Results for orders placed or performed in visit on 10/01/20  CBC  Result Value Ref Range   WBC 10.4 4.0 - 10.5 K/uL   RBC 4.52 3.87 - 5.11 Mil/uL   Platelets 361.0 150.0 - 400.0 K/uL   Hemoglobin 13.0 12.0 - 15.0 g/dL   HCT 10/03/20 18.5 - 63.1 %   MCV 84.5 78.0 - 100.0 fl   MCHC 33.9 30.0 - 36.0 g/dL   RDW 13.3  11.5 - 15.5 %  Comprehensive metabolic panel  Result Value Ref Range   Sodium 136 135 - 145 mEq/L   Potassium 4.1 3.5 - 5.1 mEq/L   Chloride 102 96 - 112 mEq/L   CO2 23 19 - 32 mEq/L   Glucose, Bld 91 70 - 99 mg/dL   BUN 7 6 - 23 mg/dL   Creatinine, Ser 5.64 0.40 - 1.20 mg/dL   Total Bilirubin 0.3 0.2 - 1.2 mg/dL   Alkaline Phosphatase 103 39 - 117 U/L   AST 15 0 - 37 U/L   ALT 17 0 - 35 U/L   Total Protein 7.0 6.0 - 8.3 g/dL   Albumin 4.2 3.5 - 5.2 g/dL   GFR 332.95 >18.84 mL/min   Calcium 9.2 8.4 - 10.5 mg/dL  Hemoglobin Z6S  Result Value Ref Range   Hgb A1c MFr Bld 5.9 4.6 - 6.5 %  Lipid panel  Result Value Ref Range   Cholesterol 216 (H) 0 - 200 mg/dL   Triglycerides 063.0 (H) 0.0 - 149.0 mg/dL   HDL 16.01 >09.32 mg/dL   VLDL 35.5 (H) 0.0 - 73.2 mg/dL   Total CHOL/HDL Ratio 3    NonHDL 136.72   TSH  Result Value Ref Range   TSH 1.81 0.35 - 4.50 uIU/mL  VITAMIN D 25 Hydroxy (Vit-D Deficiency, Fractures)  Result Value Ref Range   VITD 55.45 30.00 - 100.00 ng/mL  LDL cholesterol, direct  Result Value Ref Range   Direct LDL 106.0 mg/dL    The 20-URKY ASCVD risk score Denman George DC Jr., et al., 2013) is: 1.2%   Values used to calculate the score:     Age: 57 years     Sex: Female     Is Non-Hispanic African American: No     Diabetic: No     Tobacco smoker: No     Systolic Blood Pressure: 132 mmHg      Is BP treated: No     HDL Cholesterol: 79.2 mg/dL     Total Cholesterol: 216 mg/dL

## 2020-09-22 NOTE — Patient Instructions (Signed)
It was good to see you again today, I will be in touch with your labs as soon as possible  Good luck with getting back into a regular exercise routine and have fun on your vacation in September!   Depending on your labs we can plan to recheck in 6- 12 months   Health Maintenance, Female Adopting a healthy lifestyle and getting preventive care are important in promoting health and wellness. Ask your health care provider about: The right schedule for you to have regular tests and exams. Things you can do on your own to prevent diseases and keep yourself healthy. What should I know about diet, weight, and exercise? Eat a healthy diet  Eat a diet that includes plenty of vegetables, fruits, low-fat dairy products, and lean protein. Do not eat a lot of foods that are high in solid fats, added sugars, or sodium.  Maintain a healthy weight Body mass index (BMI) is used to identify weight problems. It estimates body fat based on height and weight. Your health care provider can help determineyour BMI and help you achieve or maintain a healthy weight. Get regular exercise Get regular exercise. This is one of the most important things you can do for your health. Most adults should: Exercise for at least 150 minutes each week. The exercise should increase your heart rate and make you sweat (moderate-intensity exercise). Do strengthening exercises at least twice a week. This is in addition to the moderate-intensity exercise. Spend less time sitting. Even light physical activity can be beneficial. Watch cholesterol and blood lipids Have your blood tested for lipids and cholesterol at 53 years of age, then havethis test every 5 years. Have your cholesterol levels checked more often if: Your lipid or cholesterol levels are high. You are older than 54 years of age. You are at high risk for heart disease. What should I know about cancer screening? Depending on your health history and family history, you  may need to have cancer screening at various ages. This may include screening for: Breast cancer. Cervical cancer. Colorectal cancer. Skin cancer. Lung cancer. What should I know about heart disease, diabetes, and high blood pressure? Blood pressure and heart disease High blood pressure causes heart disease and increases the risk of stroke. This is more likely to develop in people who have high blood pressure readings, are of African descent, or are overweight. Have your blood pressure checked: Every 3-5 years if you are 23-64 years of age. Every year if you are 83 years old or older. Diabetes Have regular diabetes screenings. This checks your fasting blood sugar level. Have the screening done: Once every three years after age 52 if you are at a normal weight and have a low risk for diabetes. More often and at a younger age if you are overweight or have a high risk for diabetes. What should I know about preventing infection? Hepatitis B If you have a higher risk for hepatitis B, you should be screened for this virus. Talk with your health care provider to find out if you are at risk forhepatitis B infection. Hepatitis C Testing is recommended for: Everyone born from [birth year] through 1963. Anyone with known risk factors for hepatitis C. Sexually transmitted infections (STIs) Get screened for STIs, including gonorrhea and chlamydia, if: You are sexually active and are younger than 53 years of age. You are older than 53 years of age and your health care provider tells you that you are at risk for this type of  infection. Your sexual activity has changed since you were last screened, and you are at increased risk for chlamydia or gonorrhea. Ask your health care provider if you are at risk. Ask your health care provider about whether you are at high risk for HIV. Your health care provider may recommend a prescription medicine to help prevent HIV infection. If you choose to take medicine to prevent  HIV, you should first get tested for HIV. You should then be tested every 3 months for as long as you are taking the medicine. Pregnancy If you are about to stop having your period (premenopausal) and you may become pregnant, seek counseling before you get pregnant. Take 400 to 800 micrograms (mcg) of folic acid every day if you become pregnant. Ask for birth control (contraception) if you want to prevent pregnancy. Osteoporosis and menopause Osteoporosis is a disease in which the bones lose minerals and strength with aging. This can result in bone fractures. If you are 46 years old or older, or if you are at risk for osteoporosis and fractures, ask your health care provider if you should: Be screened for bone loss. Take a calcium or vitamin D supplement to lower your risk of fractures. Be given hormone replacement therapy (HRT) to treat symptoms of menopause. Follow these instructions at home: Lifestyle Do not use any products that contain nicotine or tobacco, such as cigarettes, e-cigarettes, and chewing tobacco. If you need help quitting, ask your health care provider. Do not use street drugs. Do not share needles. Ask your health care provider for help if you need support or information about quitting drugs. Alcohol use Do not drink alcohol if: Your health care provider tells you not to drink. You are pregnant, may be pregnant, or are planning to become pregnant. If you drink alcohol: Limit how much you use to 0-1 drink a day. Limit intake if you are breastfeeding. Be aware of how much alcohol is in your drink. In the [place] one drink equals one 12 oz bottle of beer (355 mL), one 5 oz glass of wine (148 mL), or one 1 oz glass of hard liquor (44 mL). General instructions Schedule regular health, dental, and eye exams. Stay current with your vaccines. Tell your health care provider if: You often feel depressed. You have ever been abused or do not feel safe at home. Summary Adopting a  healthy lifestyle and getting preventive care are important in promoting health and wellness. Follow your health care provider's instructions about healthy diet, exercising, and getting tested or screened for diseases. Follow your health care provider's instructions on monitoring your cholesterol and blood pressure. This information is not intended to replace advice given to you by your health care provider. Make sure you discuss any questions you have with your healthcare provider. Document Revised: 03/24/2018 Document Reviewed: 03/24/2018 Elsevier Patient Education  2022 [employer].

## 2020-10-01 ENCOUNTER — Encounter: Payer: Self-pay | Admitting: Family Medicine

## 2020-10-01 ENCOUNTER — Ambulatory Visit (INDEPENDENT_AMBULATORY_CARE_PROVIDER_SITE_OTHER): Payer: BC Managed Care – PPO | Admitting: Family Medicine

## 2020-10-01 ENCOUNTER — Other Ambulatory Visit: Payer: Self-pay

## 2020-10-01 VITALS — BP 132/82 | HR 78 | Temp 97.5°F | Resp 16 | Ht 63.0 in | Wt 194.0 lb

## 2020-10-01 DIAGNOSIS — R7303 Prediabetes: Secondary | ICD-10-CM

## 2020-10-01 DIAGNOSIS — R5383 Other fatigue: Secondary | ICD-10-CM

## 2020-10-01 DIAGNOSIS — Z13220 Encounter for screening for lipoid disorders: Secondary | ICD-10-CM

## 2020-10-01 DIAGNOSIS — Z0000 Encounter for general adult medical examination without abnormal findings: Secondary | ICD-10-CM | POA: Diagnosis not present

## 2020-10-01 DIAGNOSIS — Z1329 Encounter for screening for other suspected endocrine disorder: Secondary | ICD-10-CM | POA: Diagnosis not present

## 2020-10-01 DIAGNOSIS — Z1159 Encounter for screening for other viral diseases: Secondary | ICD-10-CM

## 2020-10-01 DIAGNOSIS — Z130 Encounter for screening for diseases of the blood and blood-forming organs and certain disorders involving the immune mechanism: Secondary | ICD-10-CM

## 2020-10-01 LAB — LIPID PANEL
Cholesterol: 216 mg/dL — ABNORMAL HIGH (ref 0–200)
HDL: 79.2 mg/dL (ref >39.00)
NonHDL: 136.72
Total CHOL/HDL Ratio: 3
Triglycerides: 294 mg/dL — ABNORMAL HIGH (ref 0.0–149.0)
VLDL: 58.8 mg/dL — ABNORMAL HIGH (ref 0.0–40.0)

## 2020-10-01 LAB — COMPREHENSIVE METABOLIC PANEL
ALT: 17 U/L (ref 0–35)
AST: 15 U/L (ref 0–37)
Albumin: 4.2 g/dL (ref 3.5–5.2)
Alkaline Phosphatase: 103 U/L (ref 39–117)
BUN: 7 mg/dL (ref 6–23)
CO2: 23 mEq/L (ref 19–32)
Calcium: 9.2 mg/dL (ref 8.4–10.5)
Chloride: 102 mEq/L (ref 96–112)
Creatinine, Ser: 0.67 mg/dL (ref 0.40–1.20)
GFR: 100.37 mL/min (ref >60.00)
Glucose, Bld: 91 mg/dL (ref 70–99)
Potassium: 4.1 mEq/L (ref 3.5–5.1)
Sodium: 136 mEq/L (ref 135–145)
Total Bilirubin: 0.3 mg/dL (ref 0.2–1.2)
Total Protein: 7 g/dL (ref 6.0–8.3)

## 2020-10-01 LAB — VITAMIN D 25 HYDROXY (VIT D DEFICIENCY, FRACTURES): VITD: 55.45 ng/mL (ref 30.00–100.00)

## 2020-10-01 LAB — CBC
HCT: 38.2 % (ref 36.0–46.0)
Hemoglobin: 13 g/dL (ref 12.0–15.0)
MCHC: 33.9 g/dL (ref 30.0–36.0)
MCV: 84.5 fl (ref 78.0–100.0)
Platelets: 361 10*3/uL (ref 150.0–400.0)
RBC: 4.52 Mil/uL (ref 3.87–5.11)
RDW: 13.3 % (ref 11.5–15.5)
WBC: 10.4 10*3/uL (ref 4.0–10.5)

## 2020-10-01 LAB — HEMOGLOBIN A1C: Hgb A1c MFr Bld: 5.9 % (ref 4.6–6.5)

## 2020-10-01 LAB — LDL CHOLESTEROL, DIRECT: Direct LDL: 106 mg/dL

## 2020-10-01 LAB — TSH: TSH: 1.81 u[IU]/mL (ref 0.35–4.50)

## 2020-10-02 ENCOUNTER — Encounter: Payer: Self-pay | Admitting: Family Medicine

## 2020-10-02 LAB — HEPATITIS C ANTIBODY
Hepatitis C Ab: NONREACTIVE
SIGNAL TO CUT-OFF: 0.01 (ref <1.00)

## 2020-11-30 ENCOUNTER — Telehealth: Payer: BC Managed Care – PPO | Admitting: Physician Assistant

## 2020-11-30 ENCOUNTER — Telehealth: Payer: Self-pay | Admitting: Family Medicine

## 2020-11-30 DIAGNOSIS — U071 COVID-19: Secondary | ICD-10-CM

## 2020-11-30 MED ORDER — ALBUTEROL SULFATE HFA 108 (90 BASE) MCG/ACT IN AERS: 2.0000 | INHALATION_SPRAY | Freq: Four times a day (QID) | RESPIRATORY_TRACT | 0 refills | Status: DC | PRN

## 2020-11-30 MED ORDER — BENZONATATE 100 MG PO CAPS: 100.0000 mg | ORAL_CAPSULE | Freq: Three times a day (TID) | ORAL | 0 refills | Status: DC | PRN

## 2020-11-30 MED ORDER — FLUTICASONE PROPIONATE 50 MCG/ACT NA SUSP: 2.0000 | Freq: Every day | NASAL | 0 refills | Status: DC

## 2020-11-30 NOTE — Telephone Encounter (Signed)
Patient called in states she tested pos for covid today. Her Sxs began on yesterday. I recommended her to do an e-visit via mychart since its the weekend. Patient agreeded.

## 2020-11-30 NOTE — Progress Notes (Signed)
E-Visit  for Positive Covid Test Result  We are sorry you are not feeling well. We are here to help!  You have tested positive for COVID-19, meaning that you were infected with the novel coronavirus and could give the virus to others.  It is vitally important that you stay home so you do not spread it to others.      Please continue isolation at home, for at least 10 days since the start of your symptoms and until you have had 24 hours with no fever (without taking a fever reducer) and with improving of symptoms.  If you have no symptoms but tested positive (or all symptoms resolve after 5 days and you have no fever) you can leave your house but continue to wear a mask around others for an additional 5 days. If you have a fever,continue to stay home until you have had 24 hours of no fever. Most cases improve 5-10 days from onset but we have seen a small number of patients who have gotten worse after the 10 days.  Please be sure to watch for worsening symptoms and remain taking the proper precautions.   Go to the nearest hospital ED for assessment if fever/cough/breathlessness are severe or illness seems like a threat to life.    The following symptoms may appear 2-14 days after exposure: Fever Cough Shortness of breath or difficulty breathing Chills Repeated shaking with chills Muscle pain Headache Sore throat New loss of taste or smell Fatigue Congestion or runny nose Nausea or vomiting Diarrhea  You have been enrolled in [facility] for COVID-19. Daily you will receive a questionnaire within the [facility] website. Our COVID-19 response team will be monitoring your responses daily.  You can use medication such as prescription cough medication called Tessalon Perles 100 mg. You may take 1-2 capsules every 8 hours as needed for cough,  prescription inhaler called Albuterol MDI 90 mcg /actuation 2 puffs every 4 hours as needed for shortness of breath, wheezing, cough, and  prescription for Fluticasone nasal spray 2 sprays in each nostril one time per day  You may also take acetaminophen (Tylenol) as needed for fever.  If you desire the FDA-emergency use antiviral medication options, we do require a video visit for that. To access a video visit please select the "Virtual Urgent Care Visit" option in the [employer].  HOME CARE: Only take medications as instructed by your medical team. Drink plenty of fluids and get plenty of rest. A steam or ultrasonic humidifier can help if you have congestion.   GET HELP RIGHT AWAY IF YOU HAVE EMERGENCY WARNING SIGNS.  Call 911 or proceed to your closest emergency facility if: You develop worsening high fever. Trouble breathing Bluish lips or face Persistent pain or pressure in the chest New confusion Inability to wake or stay awake You cough up blood. Your symptoms become more severe Inability to hold down food or fluids  This list is not all possible symptoms. Contact your medical provider for any symptoms that are severe or concerning to you.    Your e-visit answers were reviewed by a board certified advanced clinical practitioner to complete your personal care plan.  Depending on the condition, your plan could have included both over the counter or prescription medications.  If there is a problem please reply once you have received a response from your provider.  Your safety is important to [place].  If you have drug allergies check your prescription carefully.  You can use MyChart to ask questions about today's visit, request a non-urgent call back, or ask for a work or school excuse for 24 hours related to this e-Visit. If it has been greater than 24 hours you will need to follow up with your provider, or enter a new e-Visit to address those concerns. You will get an e-mail in the next two days asking about your experience.  I hope that your e-visit has been valuable and will speed your recovery. Thank you for  using e-visits.  I provided 7 minutes of non face-to-face time during this encounter for chart review and documentation.

## 2020-12-01 ENCOUNTER — Other Ambulatory Visit: Payer: Self-pay

## 2020-12-01 ENCOUNTER — Emergency Department (INDEPENDENT_AMBULATORY_CARE_PROVIDER_SITE_OTHER)
Admission: RE | Admit: 2020-12-01 | Discharge: 2020-12-01 | Disposition: A | Discharge status: Home / Self Care | Payer: BC Managed Care – PPO | Source: Ambulatory Visit | Attending: Family Medicine | Admitting: Family Medicine

## 2020-12-01 VITALS — BP 119/78 | HR 90 | Temp 98.2°F | Resp 20 | Ht 63.0 in | Wt 191.0 lb

## 2020-12-01 DIAGNOSIS — U071 COVID-19: Secondary | ICD-10-CM

## 2020-12-01 HISTORY — DX: Gastro-esophageal reflux disease without esophagitis: K21.9

## 2020-12-01 MED ORDER — NIRMATRELVIR/RITONAVIR (PAXLOVID)TABLET: 3.0000 | ORAL_TABLET | Freq: Two times a day (BID) | ORAL | 0 refills | Status: AC

## 2020-12-01 NOTE — ED Triage Notes (Addendum)
Pt presents to Urgent Care with c/o headache, chest tightness, and loss of taste since yesterday. Tested positive for COVID yesterday; has been vaccinated.

## 2020-12-01 NOTE — Discharge Instructions (Addendum)
Take paxlovid as directed

## 2020-12-02 NOTE — ED Provider Notes (Signed)
[facility]    CSN: [id number] Arrival date & time: 12/01/20  1154      History   Chief Complaint Chief Complaint  Patient presents with   Covid Positive   Headache    HPI Monique Rivers is a 55 y.o. female.   HPI  Patient has headache, body aches, and low-grade fever.  She has been exposed to COVID.  She did COVID test at home and it was positive.  She had a E visit and was given advice on treatment of her COVID.  Prescriptions for symptomatic care.  She was told that if she desired medication she had to be seen by video visit or in person.  Patient is here requesting paxlovid.  She feels she is high risk for complications because of obesity.  She has no other medical conditions.  She is vaccinated.  Past Medical History:  Diagnosis Date   GERD (gastroesophageal reflux disease)     Patient Active Problem List   Diagnosis Date Noted   Prediabetes 02/14/2019   Acute appendicitis 02/13/2016    Past Surgical History:  Procedure Laterality Date   LAPAROSCOPIC APPENDECTOMY N/A 02/14/2016   Procedure: APPENDECTOMY LAPAROSCOPIC;  Surgeon: Avel Peace, MD;  Location: [facility];  Service: General;  Laterality: N/A;    OB History   No obstetric history on file.      Home Medications    Prior to Admission medications   Medication Sig Start Date End Date Taking? Authorizing Provider  nirmatrelvir/ritonavir EUA (PAXLOVID) 20 x 150 MG & 10 x 100MG  TABS Take 3 tablets by mouth 2 (two) times daily for 5 days. Patient GFR is 100.Take nirmatrelvir (150 mg) two tablets twice daily for 5 days and ritonavir (100 mg) one tablet twice daily for 5 days. 12/01/20 12/06/20 Yes 12/08/20, MD  albuterol (VENTOLIN HFA) 108 (90 Base) MCG/ACT inhaler Inhale 2 puffs into the lungs every 6 (six) hours as needed for wheezing or shortness of breath. 11/30/20   12/02/20, PA-C  benzonatate (TESSALON) 100 MG capsule Take 1 capsule (100 mg total) by mouth 3 (three) times  daily as needed. 11/30/20   12/02/20, PA-C  CALCIUM PO Take 1 tablet by mouth daily.    [provider]  cetirizine (ZYRTEC) 10 MG tablet Take 10 mg by mouth daily.    [provider]  fluticasone (FLONASE) 50 MCG/ACT nasal spray Place 1 spray into both nostrils daily.    [provider]  fluticasone (FLONASE) 50 MCG/ACT nasal spray Place 2 sprays into both nostrils daily. 11/30/20   Burnette, 12/02/20, PA-C  LORYNA 3-0.02 MG tablet Take 1 tablet by mouth daily. 01/23/16   [provider]  Multiple Vitamin (MULTIVITAMIN WITH MINERALS) TABS tablet Take 1 tablet by mouth daily.    [provider]  naphazoline (NAPHCON) 0.1 % ophthalmic solution Place 1 drop into both eyes daily as needed.    [provider]  pantoprazole (PROTONIX) 40 MG tablet Take 40 mg by mouth daily. 11/28/15   [provider]    Family History Family History  Problem Relation Age of Onset   Diabetes Mother    Hearing loss Mother    Early death Mother    Cancer Father    Hypertension Father    Hyperlipidemia Father    Stroke Father    Miscarriages / 11/30/15 Sister    Hearing loss Maternal Grandmother    Hyperlipidemia Maternal Grandmother    Hypertension Maternal  Grandmother    Heart disease Maternal Grandmother    Stroke Maternal Grandmother    Heart attack Maternal Grandfather    Early death Maternal Grandfather    Early death Paternal Grandmother    Cancer Paternal Grandmother    Early death Paternal Grandfather    Heart attack Paternal Grandfather    Early death Paternal Aunt    Leukemia Paternal Aunt     Social History Social History   Tobacco Use   Smoking status: Never   Smokeless tobacco: Never  Vaping Use   Vaping Use: Never used  Substance Use Topics   Alcohol use: Yes    Comment: rarely   Drug use: Never     Allergies   Amoxicillin, Hydrocodone-acetaminophen, Penicillins, and [place]   [triamcinolone]   Review of Systems Review of Systems See HPI  Physical Exam Triage Vital Signs ED Triage Vitals  Enc Vitals Group     BP 12/01/20 1247 119/78     Pulse Rate 12/01/20 1247 90     Resp 12/01/20 1247 20     Temp 12/01/20 1247 98.2 F (36.8 C)     Temp src --      SpO2 12/01/20 1247 97 %     Weight 12/01/20 1242 191 lb (86.6 kg)     Height 12/01/20 1242 5\' 3"  (1.6 m)     Head Circumference --      Peak Flow --      Pain Score 12/01/20 1242 4     Pain Loc --      Pain Edu? --      Excl. in GC? --    No data found.  Updated Vital Signs BP 119/78 (BP Location: Right Arm)   Pulse 90   Temp 98.2 F (36.8 C)   Resp 20   Ht 5\' 3"  (1.6 m)   Wt 86.6 kg   LMP 10/01/2020 Comment: Has period every 3 months--on oral contraceptive  SpO2 97%   BMI 33.83 kg/m      Physical Exam Constitutional:      General: She is not in acute distress.    Appearance: She is well-developed.     Comments: Moderately overweight  HENT:     Head: Normocephalic and atraumatic.     Mouth/Throat:     Comments: Standard mask in place Eyes:     Conjunctiva/sclera: Conjunctivae normal.     Pupils: Pupils are equal, round, and reactive to light.  Cardiovascular:     Rate and Rhythm: Normal rate.  Pulmonary:     Effort: Pulmonary effort is normal. No respiratory distress.     Comments: Lungs are clear Abdominal:     General: There is no distension.     Palpations: Abdomen is soft.  Musculoskeletal:        General: Normal range of motion.     Cervical back: Normal range of motion.  Skin:    General: Skin is warm and dry.  Neurological:     Mental Status: She is alert.     UC Treatments / Results  Labs (all labs ordered are listed, but only abnormal results are displayed) Labs Reviewed - No data to display  EKG   Radiology No results found.  Procedures Procedures (including critical care time)  Medications Ordered in UC Medications - No data to display  Initial  Impression / Assessment and Plan / UC Course  I have reviewed the triage vital signs and the nursing notes.  Pertinent labs &  imaging results that were available during my care of the patient were reviewed by me and considered in my medical decision making (see chart for details).     Patient has no contraindications to paxlovid.  Her GFR is normal.  She has other medications for symptomatic treatment (albuterol and Tessalon) quarantine discussed Final Clinical Impressions(s) / UC Diagnoses   Final diagnoses:  COVID-19     Discharge Instructions      Take paxlovid as directed    ED Prescriptions     Medication Sig Dispense Auth. Provider   nirmatrelvir/ritonavir EUA (PAXLOVID) 20 x 150 MG & 10 x 100MG  TABS Take 3 tablets by mouth 2 (two) times daily for 5 days. Patient GFR is 100.Take nirmatrelvir (150 mg) two tablets twice daily for 5 days and ritonavir (100 mg) one tablet twice daily for 5 days. 30 tablet , MD      PDMP not reviewed this encounter.   Eustace Moore, MD 12/02/20 1001

## 2020-12-22 ENCOUNTER — Emergency Department (INDEPENDENT_AMBULATORY_CARE_PROVIDER_SITE_OTHER)
Admission: EM | Admit: 2020-12-22 | Discharge: 2020-12-22 | Disposition: A | Discharge status: Home / Self Care | Payer: BC Managed Care – PPO | Source: Home / Self Care

## 2020-12-22 ENCOUNTER — Other Ambulatory Visit: Payer: Self-pay

## 2020-12-22 DIAGNOSIS — J309 Allergic rhinitis, unspecified: Secondary | ICD-10-CM

## 2020-12-22 DIAGNOSIS — R059 Cough, unspecified: Secondary | ICD-10-CM | POA: Diagnosis not present

## 2020-12-22 DIAGNOSIS — R0989 Other specified symptoms and signs involving the circulatory and respiratory systems: Secondary | ICD-10-CM

## 2020-12-22 MED ORDER — METHYLPREDNISOLONE 4 MG PO TBPK: ORAL_TABLET | ORAL | 0 refills | Status: DC

## 2020-12-22 MED ORDER — FEXOFENADINE HCL 180 MG PO TABS: 180.0000 mg | ORAL_TABLET | Freq: Every day | ORAL | 0 refills | Status: DC

## 2020-12-22 MED ORDER — METHYLPREDNISOLONE SODIUM SUCC 125 MG IJ SOLR
125.0000 mg | Freq: Once | INTRAMUSCULAR | Status: AC
  Administered 2020-12-22: 125 mg via INTRAMUSCULAR

## 2020-12-22 NOTE — ED Triage Notes (Signed)
Pt c/o cough and fatigue x 9 days. Had COVID 3 weeks ago. Testing neg. Taking Nyquil and tessalon prn.

## 2020-12-22 NOTE — Discharge Instructions (Addendum)
Advised/instructed patient to start Medrol Dosepak tomorrow, Sunday, 12/23/2020.  Advised patient to take Allegra daily for the next 7 days, then as needed for concurrent postnasal drainage.  Advised/encouraged patient to increase daily water intake while taking these medications.

## 2020-12-22 NOTE — ED Provider Notes (Signed)
[facility]    CSN: [id number] Arrival date & time: 12/22/20  1516      History   Chief Complaint Chief Complaint  Patient presents with   Cough    HPI Monique Rivers is a 55 y.o. female.   HPI 55 year old female presents with cough and fatigue for 9 days.  Reports having COVID-19 3 weeks ago, dilated and diagnosed here on 12/01/2020.  Recent home COVID-19 test is negative and has been taking OTC NyQuil and previously prescribed Tessalon Perles.  Past Medical History:  Diagnosis Date   GERD (gastroesophageal reflux disease)     Patient Active Problem List   Diagnosis Date Noted   Prediabetes 02/14/2019   Acute appendicitis 02/13/2016    Past Surgical History:  Procedure Laterality Date   LAPAROSCOPIC APPENDECTOMY N/A 02/14/2016   Procedure: APPENDECTOMY LAPAROSCOPIC;  Surgeon: Avel Peace, MD;  Location: [facility];  Service: General;  Laterality: N/A;    OB History   No obstetric history on file.      Home Medications    Prior to Admission medications   Medication Sig Start Date End Date Taking? Authorizing Provider  fexofenadine (ALLEGRA ALLERGY) 180 MG tablet Take 1 tablet (180 mg total) by mouth daily for 15 days. 12/22/20 01/06/21 Yes Trevor Iha, FNP  methylPREDNISolone (MEDROL DOSEPAK) 4 MG TBPK tablet Take as directed 12/22/20  Yes Trevor Iha, FNP  albuterol (VENTOLIN HFA) 108 (90 Base) MCG/ACT inhaler Inhale 2 puffs into the lungs every 6 (six) hours as needed for wheezing or shortness of breath. 11/30/20   Margaretann Loveless, PA-C  benzonatate (TESSALON) 100 MG capsule Take 1 capsule (100 mg total) by mouth 3 (three) times daily as needed. 11/30/20   Margaretann Loveless, PA-C  CALCIUM PO Take 1 tablet by mouth daily.    [provider]  cetirizine (ZYRTEC) 10 MG tablet Take 10 mg by mouth daily.    [provider]  fluticasone (FLONASE) 50 MCG/ACT nasal spray Place 1 spray into both nostrils daily.    [provider]  fluticasone (FLONASE) 50 MCG/ACT nasal spray Place 2 sprays into both nostrils daily. 11/30/20   Burnette, Alessandra Bevels, PA-C  LORYNA 3-0.02 MG tablet Take 1 tablet by mouth daily. 01/23/16   [provider]  Multiple Vitamin (MULTIVITAMIN WITH MINERALS) TABS tablet Take 1 tablet by mouth daily.    [provider]  naphazoline (NAPHCON) 0.1 % ophthalmic solution Place 1 drop into both eyes daily as needed.    [provider]  pantoprazole (PROTONIX) 40 MG tablet Take 40 mg by mouth daily. 11/28/15   [provider]    Family History Family History  Problem Relation Age of Onset   Diabetes Mother    Hearing loss Mother    Early death Mother    Cancer Father    Hypertension Father    Hyperlipidemia Father    Stroke Father    Miscarriages / [place] Sister    Hearing loss Maternal Grandmother    Hyperlipidemia Maternal Grandmother    Hypertension Maternal Grandmother    Heart disease Maternal Grandmother    Stroke Maternal Grandmother    Heart attack Maternal Grandfather    Early death Maternal Grandfather    Early death Paternal Grandmother    Cancer Paternal Grandmother    Early death Paternal Grandfather    Heart attack Paternal Grandfather    Early death Paternal Aunt    Leukemia Paternal Aunt     Social History  Social History   Tobacco Use   Smoking status: Never   Smokeless tobacco: Never  Vaping Use   Vaping Use: Never used  Substance Use Topics   Alcohol use: Yes    Comment: rarely   Drug use: Never     Allergies   Amoxicillin, Hydrocodone-acetaminophen, Penicillins, and [place]  [triamcinolone]   Review of Systems Review of Systems  Respiratory:  Positive for cough.        Chest congestion x 3-4 days    Physical Exam Triage Vital Signs ED Triage Vitals  Enc Vitals Group     BP 12/22/20 1522 (!) 141/80     Pulse Rate 12/22/20 1522 88     Resp 12/22/20 1522 19     Temp 12/22/20 1522 98.2 F (36.8  C)     Temp Source 12/22/20 1522 Oral     SpO2 12/22/20 1522 99 %     Weight --      Height --      Head Circumference --      Peak Flow --      Pain Score 12/22/20 1525 0     Pain Loc --      Pain Edu? --      Excl. in GC? --    No data found.  Updated Vital Signs BP (!) 141/80 (BP Location: Right Arm)   Pulse 88   Temp 98.2 F (36.8 C) (Oral)   Resp 19   SpO2 99%  Physical Exam Vitals and nursing note reviewed.  Constitutional:      General: She is not in acute distress.    Appearance: Normal appearance. She is obese. She is not ill-appearing.  HENT:     Head: Normocephalic and atraumatic.     Right Ear: Tympanic membrane, ear canal and external ear normal.     Left Ear: Tympanic membrane, ear canal and external ear normal.     Mouth/Throat:     Comments: Moderate clear drainage of posterior oropharynx noted Cardiovascular:     Rate and Rhythm: Normal rate and regular rhythm.     Pulses: Normal pulses.     Heart sounds: Normal heart sounds.  Pulmonary:     Effort: Pulmonary effort is normal.     Breath sounds: Normal breath sounds. No stridor. No wheezing, rhonchi or rales.  Musculoskeletal:     Cervical back: Normal range of motion and neck supple.  Neurological:     Mental Status: She is alert.     UC Treatments / Results  Labs (all labs ordered are listed, but only abnormal results are displayed) Labs Reviewed - No data to display  EKG   Radiology No results found.  Procedures Procedures (including critical care time)  Medications Ordered in UC Medications  methylPREDNISolone sodium succinate (SOLU-MEDROL) 125 mg/2 mL injection 125 mg (125 mg Intramuscular Given 12/22/20 1548)    Initial Impression / Assessment and Plan / UC Course  I have reviewed the triage vital signs and the nursing notes.  Pertinent labs & imaging results that were available during my care of the patient were reviewed by me and considered in my medical decision making (see  chart for details).     MDM: 1. Cough-IM Solu-Medrol 125 given once in clinic prior to discharge today; 2.  Chest congestion-Rx'd Medrol Dosepak; 3.  Allergic rhinitis, unspecified seasonality, unspecified trigger-Rx'd Allegra. Advised/instructed patient to start Medrol Dosepak tomorrow, Sunday, 12/23/2020.  Advised patient to take Allegra daily for the next 7  days, then as needed for concurrent postnasal drainage.  Advised/encouraged patient to increase daily water intake while taking these medications.  Patient discharged home, hemodynamically stable. Final Clinical Impressions(s) / UC Diagnoses   Final diagnoses:  Cough  Chest congestion  Allergic rhinitis, unspecified seasonality, unspecified trigger     Discharge Instructions      Advised/instructed patient to start Medrol Dosepak tomorrow, Sunday, 12/23/2020.  Advised patient to take Allegra daily for the next 7 days, then as needed for concurrent postnasal drainage.  Advised/encouraged patient to increase daily water intake while taking these medications.     ED Prescriptions     Medication Sig Dispense Auth. Provider   methylPREDNISolone (MEDROL DOSEPAK) 4 MG TBPK tablet Take as directed 1 each Trevor Iha, FNP   fexofenadine [facility] ALLERGY) 180 MG tablet Take 1 tablet (180 mg total) by mouth daily for 15 days. 15 tablet Trevor Iha, FNP      PDMP not reviewed this encounter.   Trevor Iha, FNP 12/22/20 1557

## 2021-05-06 ENCOUNTER — Encounter: Payer: BC Managed Care – PPO | Admitting: Family Medicine

## 2021-08-21 ENCOUNTER — Emergency Department (INDEPENDENT_AMBULATORY_CARE_PROVIDER_SITE_OTHER)
Admission: RE | Admit: 2021-08-21 | Discharge: 2021-08-21 | Disposition: A | Discharge status: Home / Self Care | Payer: BC Managed Care – PPO | Source: Ambulatory Visit

## 2021-08-21 ENCOUNTER — Emergency Department (INDEPENDENT_AMBULATORY_CARE_PROVIDER_SITE_OTHER): Payer: BC Managed Care – PPO

## 2021-08-21 VITALS — BP 149/91 | HR 88 | Temp 98.9°F | Resp 16 | Ht 63.0 in | Wt 195.0 lb

## 2021-08-21 DIAGNOSIS — R5383 Other fatigue: Secondary | ICD-10-CM

## 2021-08-21 DIAGNOSIS — J019 Acute sinusitis, unspecified: Secondary | ICD-10-CM

## 2021-08-21 DIAGNOSIS — R0981 Nasal congestion: Secondary | ICD-10-CM | POA: Diagnosis not present

## 2021-08-21 DIAGNOSIS — J309 Allergic rhinitis, unspecified: Secondary | ICD-10-CM | POA: Diagnosis not present

## 2021-08-21 DIAGNOSIS — R059 Cough, unspecified: Secondary | ICD-10-CM | POA: Diagnosis not present

## 2021-08-21 DIAGNOSIS — B9689 Other specified bacterial agents as the cause of diseases classified elsewhere: Secondary | ICD-10-CM

## 2021-08-21 MED ORDER — PREDNISONE 20 MG PO TABS: ORAL_TABLET | ORAL | 0 refills | Status: DC

## 2021-08-21 MED ORDER — CEFDINIR 300 MG PO CAPS: 300.0000 mg | ORAL_CAPSULE | Freq: Two times a day (BID) | ORAL | 0 refills | Status: AC

## 2021-08-21 MED ORDER — FEXOFENADINE HCL 180 MG PO TABS: 180.0000 mg | ORAL_TABLET | Freq: Every day | ORAL | 0 refills | Status: DC

## 2021-08-21 NOTE — ED Triage Notes (Addendum)
Started w/ sore throat w/ HA on 08/11/21 ?Fatigue ongoing  ?Congestion since 08/11/21 ?Advil cold & sinus  2 x/day  Nyquil at night  ?Denies fever ?Intermittent cough  ?Hoarse voice  ?Negative home COVID test ( all negative)  ?

## 2021-08-21 NOTE — ED Provider Notes (Signed)
?[facility] ? ? ? ?CSN: [id number] ?Arrival date & time: 08/21/21  1543 ? ? ?  ? ?History   ?Chief Complaint ?Chief Complaint  ?Patient presents with  ? Fatigue  ? ? ?HPI ?Monique Rivers is a 56 y.o. female.  ? ?HPI 5353 female presents with ongoing fatigue, intermittent cough, hoarse voice, sore throat and headache since 08/11/21. ? ?Past Medical History:  ?Diagnosis Date  ? GERD (gastroesophageal reflux disease)   ? ? ?Patient Active Problem List  ? Diagnosis Date Noted  ? Prediabetes 02/14/2019  ? Acute appendicitis 02/13/2016  ? ? ?Past Surgical History:  ?Procedure Laterality Date  ? LAPAROSCOPIC APPENDECTOMY N/A 02/14/2016  ? Procedure: APPENDECTOMY LAPAROSCOPIC;  Surgeon: Avel Peaceodd Rosenbower, MD;  Location: [facility];  Service: General;  Laterality: N/A;  ? ? ?OB History   ?No obstetric history on file. ?  ? ? ? ?Home Medications   ? ?Prior to Admission medications   ?Medication Sig Start Date End Date Taking? Authorizing Provider  ?cefdinir (OMNICEF) 300 MG capsule Take 1 capsule (300 mg total) by mouth 2 (two) times daily for 10 days. 08/21/21 08/31/21 Yes Trevor Ihaagan, Jden Want, FNP  ?fexofenadine (ALLEGRA ALLERGY) 180 MG tablet Take 1 tablet (180 mg total) by mouth daily for 15 days. 08/21/21 09/05/21 Yes Trevor Ihaagan, Carlton Sweaney, FNP  ?predniSONE (DELTASONE) 20 MG tablet Take 3 tabs PO daily x 5 days. 08/21/21  Yes Trevor Ihaagan, Lizette Pazos, FNP  ?albuterol (VENTOLIN HFA) 108 (90 Base) MCG/ACT inhaler Inhale 2 puffs into the lungs every 6 (six) hours as needed for wheezing or shortness of breath. 11/30/20   Margaretann LovelessBurnette, Jennifer M, PA-C  ?CALCIUM PO Take 1 tablet by mouth daily.    [provider]  ?cetirizine (ZYRTEC) 10 MG tablet Take 10 mg by mouth daily.    [provider]  ?fluticasone (FLONASE) 50 MCG/ACT nasal spray Place 1 spray into both nostrils daily. ?Patient not taking: Reported on 08/21/2021    [provider]  ?fluticasone (FLONASE) 50 MCG/ACT nasal spray Place 2 sprays into both nostrils daily. 11/30/20    Margaretann LovelessBurnette, Jennifer M, PA-C  ?LORYNA 3-0.02 MG tablet Take 1 tablet by mouth daily. 01/23/16   [provider]  ?methylPREDNISolone (MEDROL DOSEPAK) 4 MG TBPK tablet Take as directed ?Patient not taking: Reported on 08/21/2021 12/22/20   Trevor Ihaagan, Lester Platas, FNP  ?Multiple Vitamin (MULTIVITAMIN WITH MINERALS) TABS tablet Take 1 tablet by mouth daily.    [provider]  ?naphazoline (NAPHCON) 0.1 % ophthalmic solution Place 1 drop into both eyes daily as needed.    [provider]  ?pantoprazole (PROTONIX) 40 MG tablet Take 40 mg by mouth daily. 11/28/15   [provider]  ? ? ?Family History ?Family History  ?Problem Relation Age of Onset  ? Diabetes Mother   ? Hearing loss Mother   ? Early death Mother   ? Cancer Father   ? Hypertension Father   ? Hyperlipidemia Father   ? Stroke Father   ? Miscarriages / Stillbirths Sister   ? Hearing loss Maternal Grandmother   ? Hyperlipidemia Maternal Grandmother   ? Hypertension Maternal Grandmother   ? Heart disease Maternal Grandmother   ? Stroke Maternal Grandmother   ? Heart attack Maternal Grandfather   ? Early death Maternal Grandfather   ? Early death Paternal Grandmother   ? Cancer Paternal Grandmother   ? Early death Paternal Grandfather   ? Heart attack Paternal Grandfather   ? Early death Paternal Aunt   ? Leukemia Paternal  Aunt   ? ? ?Social History ?Social History  ? ?Tobacco Use  ? Smoking status: Never  ? Smokeless tobacco: Never  ?Vaping Use  ? Vaping Use: Never used  ?Substance Use Topics  ? Alcohol use: Yes  ?  Comment: rarely  ? Drug use: Never  ? ? ? ?Allergies   ?Amoxicillin, Hydrocodone-acetaminophen, Penicillins, and Kenac  [triamcinolone] ? ? ?Review of Systems ?Review of Systems  ?Constitutional:  Positive for fatigue.  ?HENT:  Positive for congestion, sore throat and voice change.   ?Respiratory:  Positive for cough.   ?All other systems reviewed and are negative. ? ? ?Physical Exam ?Triage Vital Signs ?ED Triage Vitals   ?Enc Vitals Group  ?   BP 08/21/21 1559 (!) 149/91  ?   Pulse Rate 08/21/21 1559 88  ?   Resp 08/21/21 1559 16  ?   Temp 08/21/21 1559 98.9 ?F (37.2 ?C)  ?   Temp Source 08/21/21 1559 Oral  ?   SpO2 08/21/21 1559 97 %  ?   Weight 08/21/21 1601 195 lb (88.5 kg)  ?   Height 08/21/21 1601 5\' 3"  (1.6 m)  ?   Head Circumference --   ?   Peak Flow --   ?   Pain Score 08/21/21 1600 2  ?   Pain Loc --   ?   Pain Edu? --   ?   Excl. in GC? --   ? ?No data found. ? ?Updated Vital Signs ?BP (!) 149/91 (BP Location: Left Arm)   Pulse 88   Temp 98.9 ?F (37.2 ?C) (Oral)   Resp 16   Ht 5\' 3"  (1.6 m)   Wt 195 lb (88.5 kg)   LMP 08/12/2021 (Exact Date)   SpO2 97%   BMI 34.54 kg/m?  ? ?   ? ?Physical Exam ?Vitals and nursing note reviewed.  ?Constitutional:   ?   General: She is not in acute distress. ?   Appearance: Normal appearance. She is obese. She is not ill-appearing.  ?HENT:  ?   Head: Normocephalic and atraumatic.  ?   Right Ear: Tympanic membrane and external ear normal.  ?   Left Ear: Tympanic membrane and external ear normal.  ?   Ears:  ?   Comments: Moderate to significant eustachian tube dysfunction noted bilaterally ?   Nose:  ?   Comments: Turbinates are erythematous/edematous ?   Mouth/Throat:  ?   Mouth: Mucous membranes are moist.  ?   Pharynx: Oropharynx is clear.  ?   Comments: Moderate amount of clear drainage of posterior oropharynx noted ?Eyes:  ?   Extraocular Movements: Extraocular movements intact.  ?   Conjunctiva/sclera: Conjunctivae normal.  ?   Pupils: Pupils are equal, round, and reactive to light.  ?Cardiovascular:  ?   Rate and Rhythm: Normal rate and regular rhythm.  ?   Pulses: Normal pulses.  ?   Heart sounds: Normal heart sounds.  ?Pulmonary:  ?   Effort: Pulmonary effort is normal.  ?   Breath sounds: Normal breath sounds. No wheezing, rhonchi or rales.  ?Musculoskeletal:  ?   Cervical back: Normal range of motion and neck supple.  ?Skin: ?   General: Skin is warm and dry.   ?Neurological:  ?   General: No focal deficit present.  ?   Mental Status: She is alert and oriented to person, place, and time.  ? ? ? ?UC Treatments / Results  ?Labs ?(all labs ordered  are listed, but only abnormal results are displayed) ?Labs Reviewed - No data to display ? ?EKG ? ? ?Radiology ?DG Chest 2 View ? ?Result Date: 08/21/2021 ?CLINICAL DATA:  cough EXAM: CHEST - 2 VIEW COMPARISON:  None Available. FINDINGS: The heart size and mediastinal contours are within normal limits. Both lungs are clear. The visualized skeletal structures are unremarkable. IMPRESSION: No active cardiopulmonary disease. Electronically Signed   By: Marjo Bicker M.D.   On: 08/21/2021 16:27   ? ?Procedures ?Procedures (including critical care time) ? ?Medications Ordered in UC ?Medications - No data to display ? ?Initial Impression / Assessment and Plan / UC Course  ?I have reviewed the triage vital signs and the nursing notes. ? ?Pertinent labs & imaging results that were available during my care of the patient were reviewed by me and considered in my medical decision making (see chart for details). ? ?  ? ?MDM: 1.  Acute bacterial rhinosinusitis-Rx'd Cefdinir; 2.  Nasal congestion-Rx'd Prednisone; 3.  Allergic rhinitis-Rx'd Allegra.  4.  Other fatigue-Discussion with patient regarding cause of fatigue which can range from OSA/sleep habits, any nutritional habits, exercise habits/tolerance, baseline serological testing, to depression.  Advised patient to follow-up with PCP for further evaluation of fatigue.  CXR negative for acute cardiopulmonary process. Instructed patient to take medication as directed with food to completion.  Advised patient to take Prednisone and Allegra with first dose of Cefdinir for the next 5 of 10 days.  Advised may use Allegra as needed afterwards for comfort concurrent postnasal drainage/drip.  Encouraged increase daily water intake while taking these medications.  Advised if symptoms worsen and/or  unresolved please follow-up with PCP or here for further evaluation.  Patient discharged home, hemodynamically stable. ?Final Clinical Impressions(s) / UC Diagnoses  ? ?Final diagnoses:  ?Other fatigue  ?Acute bacterial rhin

## 2021-08-21 NOTE — Discharge Instructions (Addendum)
Instructed patient to take medication as directed with food to completion.  Advised patient to take Prednisone and Allegra with first dose of Cefdinir for the next 5 of 10 days.  Advised may use Allegra as needed afterwards for comfort concurrent postnasal drainage/drip.  Encouraged increase daily water intake while taking these medications.  Advised if symptoms worsen and/or unresolved please follow-up with PCP or here for further evaluation. ?

## 2021-08-22 ENCOUNTER — Telehealth: Payer: Self-pay | Admitting: Emergency Medicine

## 2021-08-22 MED ORDER — AZITHROMYCIN 250 MG PO TABS: 250.0000 mg | ORAL_TABLET | Freq: Every day | ORAL | 0 refills | Status: DC

## 2021-08-22 NOTE — Telephone Encounter (Addendum)
Voice mail left by Noreene Larsson requesting a different antibiotic - current one has given her GI upset and has been up since 0300 with diarrhea. Pt stated a z-pak has worked in the past. Provider to review & RN will call Ashante back with an update.Call back by RN who confirmed a new antibiotic had been sent in. RN also reviewed prednisone dosing - ok to take 2 tabs in am w/ breakfast and the 3rd one at lunch. No other questions at this time. Also ok to take a probiotic for diarrhea ?

## 2021-08-22 NOTE — Telephone Encounter (Signed)
Patient reports Cefdinir is causing diarrhea and request Zithromax as replacement antibiotic. ?

## 2021-10-09 ENCOUNTER — Encounter: Payer: BC Managed Care – PPO | Admitting: Family Medicine

## 2021-12-05 ENCOUNTER — Encounter: Payer: BC Managed Care – PPO | Admitting: Family Medicine

## 2022-01-09 NOTE — Progress Notes (Signed)
[facility] [address] [place], [place] [address] [phone]  Date:  01/13/2022   Name:  Monique Rivers   DOB:  [birth year]   MRN:  [id number]  PCP:  Pearline Cables, MD    Chief Complaint: Annual Exam (Concerns/ questions: none/Pap/Mammogram: sees [facility]/Flu shot today: yes, covid scheded for thursday/)   History of Present Illness:  Monique Rivers is a 56 y.o. 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  Single, no children  Her father has history of bladder cancer, he is a former smoker He completed his treatment and is doing well so far   Pap smear- she will be seen this month Mammogram- this is UTD  Flu vaccine- give today COVID booster- she has scheduled this week Colon cancer screening up-to-date Shingles is complete  Taking allergy medication, birth control pills Labs 1 year ago, update today  Admits she is not getting a lot of exercise- encouraged her to work on this  She does walk her dog but this is not typically vigorous exercise  Wt Readings from Last 3 Encounters:  01/13/22 195 lb 8 oz (88.7 kg)  08/21/21 195 lb (88.5 kg)  12/01/20 191 lb (86.6 kg)  No CP or SOB   Patient Active Problem List   Diagnosis Date Noted   Prediabetes 02/14/2019   Acute appendicitis 02/13/2016    Past Medical History:  Diagnosis Date   GERD (gastroesophageal reflux disease)     Past Surgical History:  Procedure Laterality Date   LAPAROSCOPIC APPENDECTOMY N/A 02/14/2016   Procedure: APPENDECTOMY LAPAROSCOPIC;  Surgeon: Avel Peace, MD;  Location: [facility];  Service: General;  Laterality: N/A;    Social History   Tobacco Use   Smoking status: Never    Smokeless tobacco: Never  Vaping Use   Vaping Use: Never used  Substance Use Topics   Alcohol use: Yes    Comment: rarely   Drug use: Never    Family History  Problem Relation Age of Onset   Diabetes Mother    Hearing loss Mother    Early death Mother    Cancer Father    Hypertension Father    Hyperlipidemia Father    Stroke Father    Miscarriages / [place] Sister    Hearing loss Maternal Grandmother    Hyperlipidemia Maternal Grandmother    Hypertension Maternal Grandmother    Heart disease Maternal Grandmother    Stroke Maternal Grandmother    Heart attack Maternal Grandfather    Early death Maternal Grandfather    Early death Paternal Grandmother    Cancer Paternal Grandmother    Early death Paternal Grandfather    Heart attack Paternal Grandfather    Early death Paternal Aunt    Leukemia Paternal Aunt     Allergies  Allergen Reactions   Amoxicillin Nausea And Vomiting    Has patient had a PCN reaction causing immediate rash, facial/tongue/throat swelling, SOB or lightheadedness with hypotension: No Has patient had a PCN reaction causing severe rash involving mucus membranes or skin necrosis: No Has patient had a PCN reaction that required hospitalization: No Has patient had a PCN reaction occurring within the last 10 years: No If all of the  above answers are "NO", then may proceed with Cephalosporin use.    Hydrocodone-Acetaminophen Other (See Comments)    "made me crazy"   Penicillins    Kenac  [Triamcinolone] Rash    Medication list has been reviewed and updated.  Current Outpatient Medications on File Prior to Visit  Medication Sig Dispense Refill   CALCIUM PO Take 1 tablet by mouth daily.     cetirizine (ZYRTEC) 10 MG tablet Take 10 mg by mouth daily.     fluticasone (FLONASE) 50 MCG/ACT nasal spray Place 1 spray into both nostrils daily.     LORYNA 3-0.02 MG tablet Take 1 tablet by mouth daily.     Multiple Vitamin (MULTIVITAMIN WITH MINERALS)  TABS tablet Take 1 tablet by mouth daily.     naphazoline (NAPHCON) 0.1 % ophthalmic solution Place 1 drop into both eyes daily as needed.     pantoprazole (PROTONIX) 40 MG tablet Take 40 mg by mouth daily.     fexofenadine (ALLEGRA ALLERGY) 180 MG tablet Take 1 tablet (180 mg total) by mouth daily for 15 days. 15 tablet 0   No current facility-administered medications on file prior to visit.    Review of Systems:  As per HPI- otherwise negative.   Physical Examination: Vitals:   01/13/22 0830  BP: 118/78  Pulse: 81  Resp: 18  Temp: 97.8 F (36.6 C)  SpO2: 96%   Vitals:   01/13/22 0830  Weight: 195 lb 8 oz (88.7 kg)  Height: 5\' 3"  (1.6 m)   Body mass index is 34.63 kg/m. Ideal Body Weight: Weight in (lb) to have BMI = 25: 140.8  GEN: no acute distress.  Obese, looks well HEENT: Atraumatic, Normocephalic. Bilateral TM wnl, oropharynx normal.  PEERL,EOMI.   Ears and Nose: No external deformity. CV: RRR, No M/G/R. No JVD. No thrill. No extra heart sounds. PULM: CTA B, no wheezes, crackles, rhonchi. No retractions. No resp. distress. No accessory muscle use. ABD: S, NT, ND, +BS. No rebound. No HSM. EXTR: No c/c/e PSYCH: Normally interactive. Conversant.    Assessment and Plan: Physical exam  Screening for thyroid disorder - Plan: TSH  Prediabetes - Plan: Comprehensive metabolic panel, Hemoglobin A1c  Screening for hyperlipidemia - Plan: Lipid panel  Screening for deficiency anemia - Plan: CBC  Fatigue, unspecified type - Plan: TSH, VITAMIN D 25 Hydroxy (Vit-D Deficiency, Fractures)  Low magnesium level - Plan: Magnesium  Need for influenza vaccination - Plan: Flu Vaccine QUAD 6+ mos PF IM (Fluarix Quad PF)  Physical exam today.  Encouraged healthy diet and exercise routine Will plan further follow- up pending labs.  Pt notes she would like to work on lifestyle and exercise for 6 months if her "numbers are bad" prior to starting on any medication  Flu shot  given  Signed , MD  Received labs as below, message to patient Results for orders placed or performed in visit on 01/13/22  CBC  Result Value Ref Range   WBC 10.6 (H) 4.0 - 10.5 K/uL   RBC 4.60 3.87 - 5.11 Mil/uL   Platelets 324.0 150.0 - 400.0 K/uL   Hemoglobin 13.3 12.0 - 15.0 g/dL   HCT 03/15/22 26.2 - 03.5 %   MCV 85.6 78.0 - 100.0 fl   MCHC 33.7 30.0 - 36.0 g/dL   RDW 59.7 41.6 - 38.4 %  Comprehensive metabolic panel  Result Value Ref Range   Sodium 134 (L) 135 - 145 mEq/L   Potassium 4.2 3.5 - 5.1  mEq/L   Chloride 101 96 - 112 mEq/L   CO2 22 19 - 32 mEq/L   Glucose, Bld 92 70 - 99 mg/dL   BUN 9 6 - 23 mg/dL   Creatinine, Ser 0.72 0.40 - 1.20 mg/dL   Total Bilirubin 0.3 0.2 - 1.2 mg/dL   Alkaline Phosphatase 109 39 - 117 U/L   AST 19 0 - 37 U/L   ALT 21 0 - 35 U/L   Total Protein 6.9 6.0 - 8.3 g/dL   Albumin 4.1 3.5 - 5.2 g/dL   GFR 95.15 >60.00 mL/min   Calcium 9.4 8.4 - 10.5 mg/dL  Hemoglobin A1c  Result Value Ref Range   Hgb A1c MFr Bld 6.2 4.6 - 6.5 %  Lipid panel  Result Value Ref Range   Cholesterol 207 (H) 0 - 200 mg/dL   Triglycerides 269.0 (H) 0.0 - 149.0 mg/dL   HDL 74.80 >39.00 mg/dL   VLDL 53.8 (H) 0.0 - 40.0 mg/dL   Total CHOL/HDL Ratio 3    NonHDL 132.53   TSH  Result Value Ref Range   TSH 2.27 0.35 - 5.50 uIU/mL  VITAMIN D 25 Hydroxy (Vit-D Deficiency, Fractures)  Result Value Ref Range   VITD 58.50 30.00 - 100.00 ng/mL  Magnesium  Result Value Ref Range   Magnesium 2.0 1.5 - 2.5 mg/dL  LDL cholesterol, direct  Result Value Ref Range   Direct LDL 111.0 mg/dL

## 2022-01-09 NOTE — Patient Instructions (Addendum)
It was great to see you again today, I will be in touch with your labs  Recommend COVID shot this fall-  Flu shot today Work on exercise!  You can do it! Aim for 5 days a week if you can and build up gradually. Perhaps sign up for a program or other challenge on your Pel so you have a goal

## 2022-01-13 ENCOUNTER — Ambulatory Visit (INDEPENDENT_AMBULATORY_CARE_PROVIDER_SITE_OTHER): Payer: BC Managed Care – PPO | Admitting: Family Medicine

## 2022-01-13 ENCOUNTER — Encounter: Payer: Self-pay | Admitting: Family Medicine

## 2022-01-13 VITALS — BP 118/78 | HR 81 | Temp 97.8°F | Resp 18 | Ht 63.0 in | Wt 195.5 lb

## 2022-01-13 DIAGNOSIS — R7303 Prediabetes: Secondary | ICD-10-CM | POA: Diagnosis not present

## 2022-01-13 DIAGNOSIS — Z1329 Encounter for screening for other suspected endocrine disorder: Secondary | ICD-10-CM

## 2022-01-13 DIAGNOSIS — Z13220 Encounter for screening for lipoid disorders: Secondary | ICD-10-CM | POA: Diagnosis not present

## 2022-01-13 DIAGNOSIS — R790 Abnormal level of blood mineral: Secondary | ICD-10-CM

## 2022-01-13 DIAGNOSIS — Z130 Encounter for screening for diseases of the blood and blood-forming organs and certain disorders involving the immune mechanism: Secondary | ICD-10-CM | POA: Diagnosis not present

## 2022-01-13 DIAGNOSIS — R5383 Other fatigue: Secondary | ICD-10-CM | POA: Diagnosis not present

## 2022-01-13 DIAGNOSIS — Z23 Encounter for immunization: Secondary | ICD-10-CM | POA: Diagnosis not present

## 2022-01-13 DIAGNOSIS — Z0000 Encounter for general adult medical examination without abnormal findings: Secondary | ICD-10-CM | POA: Diagnosis not present

## 2022-01-13 LAB — LIPID PANEL
Cholesterol: 207 mg/dL — ABNORMAL HIGH (ref 0–200)
HDL: 74.8 mg/dL (ref >39.00)
NonHDL: 132.53
Total CHOL/HDL Ratio: 3
Triglycerides: 269 mg/dL — ABNORMAL HIGH (ref 0.0–149.0)
VLDL: 53.8 mg/dL — ABNORMAL HIGH (ref 0.0–40.0)

## 2022-01-13 LAB — CBC
HCT: 39.4 % (ref 36.0–46.0)
Hemoglobin: 13.3 g/dL (ref 12.0–15.0)
MCHC: 33.7 g/dL (ref 30.0–36.0)
MCV: 85.6 fl (ref 78.0–100.0)
Platelets: 324 10*3/uL (ref 150.0–400.0)
RBC: 4.6 Mil/uL (ref 3.87–5.11)
RDW: 13.3 % (ref 11.5–15.5)
WBC: 10.6 10*3/uL — ABNORMAL HIGH (ref 4.0–10.5)

## 2022-01-13 LAB — COMPREHENSIVE METABOLIC PANEL
ALT: 21 U/L (ref 0–35)
AST: 19 U/L (ref 0–37)
Albumin: 4.1 g/dL (ref 3.5–5.2)
Alkaline Phosphatase: 109 U/L (ref 39–117)
BUN: 9 mg/dL (ref 6–23)
CO2: 22 mEq/L (ref 19–32)
Calcium: 9.4 mg/dL (ref 8.4–10.5)
Chloride: 101 mEq/L (ref 96–112)
Creatinine, Ser: 0.72 mg/dL (ref 0.40–1.20)
GFR: 95.15 mL/min (ref >60.00)
Glucose, Bld: 92 mg/dL (ref 70–99)
Potassium: 4.2 mEq/L (ref 3.5–5.1)
Sodium: 134 mEq/L — ABNORMAL LOW (ref 135–145)
Total Bilirubin: 0.3 mg/dL (ref 0.2–1.2)
Total Protein: 6.9 g/dL (ref 6.0–8.3)

## 2022-01-13 LAB — MAGNESIUM: Magnesium: 2 mg/dL (ref 1.5–2.5)

## 2022-01-13 LAB — LDL CHOLESTEROL, DIRECT: Direct LDL: 111 mg/dL

## 2022-01-13 LAB — TSH: TSH: 2.27 u[IU]/mL (ref 0.35–5.50)

## 2022-01-13 LAB — HEMOGLOBIN A1C: Hgb A1c MFr Bld: 6.2 % (ref 4.6–6.5)

## 2022-01-13 LAB — VITAMIN D 25 HYDROXY (VIT D DEFICIENCY, FRACTURES): VITD: 58.5 ng/mL (ref 30.00–100.00)

## 2022-02-05 ENCOUNTER — Ambulatory Visit (INDEPENDENT_AMBULATORY_CARE_PROVIDER_SITE_OTHER): Payer: BC Managed Care – PPO

## 2022-02-05 ENCOUNTER — Ambulatory Visit
Admission: RE | Admit: 2022-02-05 | Discharge: 2022-02-05 | Disposition: A | Discharge status: Home / Self Care | Payer: BC Managed Care – PPO | Source: Ambulatory Visit

## 2022-02-05 ENCOUNTER — Ambulatory Visit: Payer: Self-pay

## 2022-02-05 VITALS — BP 132/81 | HR 89 | Temp 98.5°F | Resp 20 | Ht 62.75 in | Wt 194.0 lb

## 2022-02-05 DIAGNOSIS — R059 Cough, unspecified: Secondary | ICD-10-CM | POA: Diagnosis not present

## 2022-02-05 MED ORDER — DOXYCYCLINE HYCLATE 100 MG PO CAPS: 100.0000 mg | ORAL_CAPSULE | Freq: Two times a day (BID) | ORAL | 0 refills | Status: AC

## 2022-02-05 MED ORDER — PROMETHAZINE-DM 6.25-15 MG/5ML PO SYRP: 5.0000 mL | ORAL_SOLUTION | Freq: Two times a day (BID) | ORAL | 0 refills | Status: DC | PRN

## 2022-02-05 NOTE — ED Provider Notes (Signed)
[facility]    CSN: [id number] Arrival date & time: 02/05/22  0950      History   Chief Complaint Chief Complaint  Patient presents with   Cough    HPI Monique Rivers is a 56 y.o. female.   HPI 56 year old female presents with cough for 2 weeks.  Reports 3 recent negative home COVID-19 test.  PMH significant for obesity and GERD.  Past Medical History:  Diagnosis Date   GERD (gastroesophageal reflux disease)     Patient Active Problem List   Diagnosis Date Noted   Prediabetes 02/14/2019   Acute appendicitis 02/13/2016    Past Surgical History:  Procedure Laterality Date   LAPAROSCOPIC APPENDECTOMY N/A 02/14/2016   Procedure: APPENDECTOMY LAPAROSCOPIC;  Surgeon: Jackolyn Confer, MD;  Location: [facility];  Service: General;  Laterality: N/A;    OB History   No obstetric history on file.      Home Medications    Prior to Admission medications   Medication Sig Start Date End Date Taking? Authorizing Provider  doxycycline (VIBRAMYCIN) 100 MG capsule Take 1 capsule (100 mg total) by mouth 2 (two) times daily for 10 days. 02/05/22 02/15/22 Yes Eliezer Lofts, FNP  fexofenadine [facility] ALLERGY) 180 MG tablet Take 1 tablet (180 mg total) by mouth daily for 15 days. 08/21/21 02/05/22 Yes Eliezer Lofts, FNP  promethazine-dextromethorphan (PROMETHAZINE-DM) 6.25-15 MG/5ML syrup Take 5 mLs by mouth 2 (two) times daily as needed for cough. 02/05/22  Yes Eliezer Lofts, FNP  Pseudoeph-Doxylamine-DM-APAP (DAYQUIL/NYQUIL COLD/FLU RELIEF PO) Take by mouth.   Yes [provider]  CALCIUM PO Take 1 tablet by mouth daily.    [provider]  cetirizine (ZYRTEC) 10 MG tablet Take 10 mg by mouth daily.    [provider]  fluticasone (FLONASE) 50 MCG/ACT nasal spray Place 1 spray into both nostrils daily.    [provider]  LORYNA 3-0.02 MG tablet Take 1 tablet by mouth daily. 01/23/16   [provider]  Multiple Vitamin  (MULTIVITAMIN WITH MINERALS) TABS tablet Take 1 tablet by mouth daily.    [provider]  naphazoline (NAPHCON) 0.1 % ophthalmic solution Place 1 drop into both eyes daily as needed.    [provider]  pantoprazole (PROTONIX) 40 MG tablet Take 40 mg by mouth daily. 11/28/15   [provider]    Family History Family History  Problem Relation Age of Onset   Diabetes Mother    Hearing loss Mother    Early death Mother    Cancer Father    Hypertension Father    Hyperlipidemia Father    Stroke Father    78 / [place] Sister    Hearing loss Maternal Grandmother    Hyperlipidemia Maternal Grandmother    Hypertension Maternal Grandmother    Heart disease Maternal Grandmother    Stroke Maternal Grandmother    Heart attack Maternal Grandfather    Early death Maternal Grandfather    Early death Paternal Grandmother    Cancer Paternal Grandmother    Early death Paternal Grandfather    Heart attack Paternal Grandfather    Early death Paternal 50    Leukemia Paternal Aunt     Social History Social History   Tobacco Use   Smoking status: Never   Smokeless tobacco: Never  Vaping Use   Vaping Use: Never used  Substance Use Topics   Alcohol use: Yes    Comment: rarely   Drug use: Never     Allergies  Amoxicillin, Hydrocodone-acetaminophen, Penicillins, and Kenac  [triamcinolone]   Review of Systems Review of Systems  Respiratory:  Positive for cough.   All other systems reviewed and are negative.    Physical Exam Triage Vital Signs ED Triage Vitals  Enc Vitals Group     BP 02/05/22 1017 132/81     Pulse Rate 02/05/22 1017 89     Resp 02/05/22 1017 20     Temp 02/05/22 1017 98.5 F (36.9 C)     Temp Source 02/05/22 1017 Oral     SpO2 02/05/22 1017 96 %     Weight 02/05/22 1010 194 lb (88 kg)     Height 02/05/22 1010 5' 2.75" (1.594 m)     Head Circumference --      Peak Flow --      Pain Score 02/05/22 1010 0     Pain  Loc --      Pain Edu? --      Excl. in [place]? --    No data found.  Updated Vital Signs BP 132/81   Pulse 89   Temp 98.5 F (36.9 C) (Oral)   Resp 20   Ht 5' 2.75" (1.594 m)   Wt 194 lb (88 kg)   LMP 01/20/2022 (Exact Date)   SpO2 96%   BMI 34.64 kg/m    Physical Exam Vitals and nursing note reviewed.  Constitutional:      General: She is not in acute distress.    Appearance: Normal appearance. She is obese.  HENT:     Head: Normocephalic and atraumatic.     Right Ear: Tympanic membrane, ear canal and external ear normal.     Left Ear: Tympanic membrane, ear canal and external ear normal.     Mouth/Throat:     Mouth: Mucous membranes are moist.     Pharynx: Oropharynx is clear.  Eyes:     Extraocular Movements: Extraocular movements intact.     Conjunctiva/sclera: Conjunctivae normal.     Pupils: Pupils are equal, round, and reactive to light.  Cardiovascular:     Rate and Rhythm: Normal rate and regular rhythm.     Pulses: Normal pulses.     Heart sounds: Normal heart sounds. No murmur heard. Pulmonary:     Effort: Pulmonary effort is normal.     Breath sounds: No wheezing, rhonchi or rales.     Comments: Diminished breath sounds bibasilarly, frequent nonproductive cough noted Musculoskeletal:        General: Normal range of motion.     Cervical back: Normal range of motion and neck supple.  Skin:    General: Skin is warm and dry.  Neurological:     General: No focal deficit present.     Mental Status: She is alert and oriented to person, place, and time. Mental status is at baseline.      UC Treatments / Results  Labs (all labs ordered are listed, but only abnormal results are displayed) Labs Reviewed - No data to display  EKG   Radiology DG Chest 2 View  Result Date: 02/05/2022 CLINICAL DATA:  56 year old female with cough for 2 weeks. Tested negative for COVID-19. EXAM: CHEST - 2 VIEW COMPARISON:  Chest radiographs 08/21/2021. FINDINGS: Lung  volumes and mediastinal contours remain normal. Visualized tracheal air column is within normal limits. Lung markings are stable and within normal limits. No pneumothorax, pulmonary edema, pleural effusion, or confluent pulmonary opacity. No acute osseous abnormality identified.  Paucity of bowel gas. IMPRESSION:  Stable since May, no cardiopulmonary abnormality identified. Electronically Signed   By: Genevie Ann M.D.   On: 02/05/2022 10:44    Procedures Procedures (including critical care time)  Medications Ordered in UC Medications - No data to display  Initial Impression / Assessment and Plan / UC Course  I have reviewed the triage vital signs and the nursing notes.  Pertinent labs & imaging results that were available during my care of the patient were reviewed by me and considered in my medical decision making (see chart for details).     MDM: 1.  Cough-CXR revealed above, Rx'd doxycycline, Promethazine DM. Advised patient of chest x-ray results with hard copy provided to patient.  Advised patient to take medication as directed with food to completion.  Advised patient may use promethazine at night for cough due to sedative effects.  Encourage patient to increase daily water intake to 64 ounces per day while taking these medications.  Advised if symptoms worsen and/or unresolved please follow-up with PCP or here for further evaluation.  Patient discharged home, hemodynamically stable. Final Clinical Impressions(s) / UC Diagnoses   Final diagnoses:  Cough, unspecified type     Discharge Instructions      Advised patient of chest x-ray results with hard copy provided to patient.  Advised patient to take medication as directed with food to completion.  Advised patient may use promethazine at night for cough due to sedative effects.  Encourage patient to increase daily water intake to 64 ounces per day while taking these medications.  Advised if symptoms worsen and/or unresolved please  follow-up with PCP or here for further evaluation.     ED Prescriptions     Medication Sig Dispense Auth. Provider   doxycycline (VIBRAMYCIN) 100 MG capsule Take 1 capsule (100 mg total) by mouth 2 (two) times daily for 10 days. 20 capsule Eliezer Lofts, FNP   promethazine-dextromethorphan (PROMETHAZINE-DM) 6.25-15 MG/5ML syrup Take 5 mLs by mouth 2 (two) times daily as needed for cough. 118 mL Eliezer Lofts, FNP      PDMP not reviewed this encounter.   Eliezer Lofts, FNP 02/05/22 1130

## 2022-02-05 NOTE — Discharge Instructions (Addendum)
Advised patient of chest x-ray results with hard copy provided to patient.  Advised patient to take medication as directed with food to completion.  Advised patient may use promethazine at night for cough due to sedative effects.  Encourage patient to increase daily water intake to 64 ounces per day while taking these medications.  Advised if symptoms worsen and/or unresolved please follow-up with PCP or here for further evaluation.

## 2022-02-05 NOTE — ED Triage Notes (Signed)
Pt presents to Urgent Care with c/o cough x 2 weeks. No fever or other s/s. Reports 3 negative COVID tests.

## 2022-02-10 ENCOUNTER — Encounter: Payer: Self-pay | Admitting: Family Medicine

## 2022-02-10 ENCOUNTER — Ambulatory Visit (INDEPENDENT_AMBULATORY_CARE_PROVIDER_SITE_OTHER): Payer: BC Managed Care – PPO | Admitting: Family Medicine

## 2022-02-10 VITALS — BP 120/80 | HR 87 | Temp 97.7°F | Resp 20 | Ht 62.75 in | Wt 196.4 lb

## 2022-02-10 DIAGNOSIS — J40 Bronchitis, not specified as acute or chronic: Secondary | ICD-10-CM

## 2022-02-10 MED ORDER — METHYLPREDNISOLONE ACETATE 80 MG/ML IJ SUSP
80.0000 mg | Freq: Once | INTRAMUSCULAR | Status: AC
  Administered 2022-02-10: 80 mg via INTRAMUSCULAR

## 2022-02-10 MED ORDER — PREDNISONE 10 MG PO TABS: ORAL_TABLET | ORAL | 0 refills | Status: DC

## 2022-02-10 MED ORDER — AZITHROMYCIN 250 MG PO TABS: ORAL_TABLET | ORAL | 0 refills | Status: DC

## 2022-02-10 NOTE — Progress Notes (Addendum)
Subjective:   By signing my name below, I, Monique Rivers, attest that this documentation has been prepared under the direction and in the presence of Ann Held DO 02/10/2022   Patient ID: Monique Rivers, female    DOB: 01/30/1966, 56 y.o.   MRN: [id number]  Chief Complaint  Patient presents with   Cough    Pt went to UC 5 days ago. Pt was given a antibiotic and a cough syrup. Pt states cough will not go away    HPI Patient is in today for an office visit  She complains of a persistent cough. She repots that she went to an urgent care on 02/05/2022 and was prescribed 100 Mg of Doxycyline and 6.25-15 Mg/5 mL of Promethazine-DM. She states that it takes her some time to fall asleep but once she falls asleep, she can sleep throughout the night. She also notes of some rattling in her chest.   Past Medical History:  Diagnosis Date   GERD (gastroesophageal reflux disease)     Past Surgical History:  Procedure Laterality Date   LAPAROSCOPIC APPENDECTOMY N/A 02/14/2016   Procedure: APPENDECTOMY LAPAROSCOPIC;  Surgeon: Jackolyn Confer, MD;  Location: [facility];  Service: General;  Laterality: N/A;    Family History  Problem Relation Age of Onset   Diabetes Mother    Hearing loss Mother    Early death Mother    Cancer Father    Hypertension Father    Hyperlipidemia Father    Stroke Father    79 / [place] Sister    Hearing loss Maternal Grandmother    Hyperlipidemia Maternal Grandmother    Hypertension Maternal Grandmother    Heart disease Maternal Grandmother    Stroke Maternal Grandmother    Heart attack Maternal Grandfather    Early death Maternal Grandfather    Early death Paternal Grandmother    Cancer Paternal Grandmother    Early death Paternal Grandfather    Heart attack Paternal Grandfather    Early death Paternal 96    Leukemia Paternal Aunt     Social History   Socioeconomic History   Marital status: Single    Spouse name: Not on file    Number of children: Not on file   Years of education: Not on file   Highest education level: Not on file  Occupational History   Not on file  Tobacco Use   Smoking status: Never   Smokeless tobacco: Never  Vaping Use   Vaping Use: Never used  Substance and Sexual Activity   Alcohol use: Yes    Comment: rarely   Drug use: Never   Sexual activity: Not on file  Other Topics Concern   Not on file  Social History Narrative   Not on file   Social Determinants of Health   Financial Resource Strain: Not on file  Food Insecurity: Not on file  Transportation Needs: Not on file  Physical Activity: Not on file  Stress: Not on file  Social Connections: Not on file  Intimate Partner Violence: Not on file    Outpatient Medications Prior to Visit  Medication Sig Dispense Refill   CALCIUM PO Take 1 tablet by mouth daily.     cetirizine (ZYRTEC) 10 MG tablet Take 10 mg by mouth daily.     doxycycline (VIBRAMYCIN) 100 MG capsule Take 1 capsule (100 mg total) by mouth 2 (two) times daily for 10 days. 20 capsule 0   fluticasone (FLONASE) 50 MCG/ACT nasal spray Place 1  spray into both nostrils daily.     LORYNA 3-0.02 MG tablet Take 1 tablet by mouth daily.     Multiple Vitamin (MULTIVITAMIN WITH MINERALS) TABS tablet Take 1 tablet by mouth daily.     naphazoline (NAPHCON) 0.1 % ophthalmic solution Place 1 drop into both eyes daily as needed.     pantoprazole (PROTONIX) 40 MG tablet Take 40 mg by mouth daily.     promethazine-dextromethorphan (PROMETHAZINE-DM) 6.25-15 MG/5ML syrup Take 5 mLs by mouth 2 (two) times daily as needed for cough. 118 mL 0   Pseudoeph-Doxylamine-DM-APAP (DAYQUIL/NYQUIL COLD/FLU RELIEF PO) Take by mouth.     fexofenadine (ALLEGRA ALLERGY) 180 MG tablet Take 1 tablet (180 mg total) by mouth daily for 15 days. 15 tablet 0   No facility-administered medications prior to visit.    Allergies  Allergen Reactions   Amoxicillin Nausea And Vomiting    Has patient had  a PCN reaction causing immediate rash, facial/tongue/throat swelling, SOB or lightheadedness with hypotension: No Has patient had a PCN reaction causing severe rash involving mucus membranes or skin necrosis: No Has patient had a PCN reaction that required hospitalization: No Has patient had a PCN reaction occurring within the last 10 years: No If all of the above answers are "NO", then may proceed with Cephalosporin use.    Hydrocodone-Acetaminophen Other (See Comments)    "made me crazy"   Penicillins    Kenac  [Triamcinolone] Rash    Review of Systems  Constitutional:  Negative for fever and malaise/fatigue.  HENT:  Negative for congestion.   Eyes:  Negative for blurred vision.  Respiratory:  Positive for cough. Negative for shortness of breath.   Cardiovascular:  Negative for chest pain, palpitations and leg swelling.  Gastrointestinal:  Negative for abdominal pain, blood in stool and nausea.  Genitourinary:  Negative for dysuria and frequency.  Musculoskeletal:  Negative for falls.  Skin:  Negative for rash.  Neurological:  Negative for dizziness, loss of consciousness and headaches.  Endo/Heme/Allergies:  Negative for environmental allergies.  Psychiatric/Behavioral:  Negative for depression. The patient is not nervous/anxious.        Objective:    Physical Exam Vitals and nursing note reviewed.  Constitutional:      General: She is not in acute distress.    Appearance: Normal appearance. She is not ill-appearing.  HENT:     Head: Normocephalic and atraumatic.     Right Ear: External ear normal.     Left Ear: External ear normal.  Eyes:     Extraocular Movements: Extraocular movements intact.     Pupils: Pupils are equal, round, and reactive to light.  Cardiovascular:     Rate and Rhythm: Normal rate and regular rhythm.     Heart sounds: Normal heart sounds. No murmur heard.    No gallop.  Pulmonary:     Effort: Pulmonary effort is normal. No respiratory distress.      Breath sounds: Wheezing (Expiratory) present. No rales.  Skin:    General: Skin is warm and dry.  Neurological:     Mental Status: She is alert and oriented to person, place, and time.  Psychiatric:        Judgment: Judgment normal.     BP 120/80 (BP Location: Left Arm, Patient Position: Sitting, Cuff Size: Large)   Pulse 87   Temp 97.7 F (36.5 C) (Oral)   Resp 20   Ht 5' 2.75" (1.594 m)   Wt 196 lb 6.4 oz (89.1 kg)  LMP 01/20/2022 (Exact Date)   SpO2 98%   BMI 35.07 kg/m  Wt Readings from Last 3 Encounters:  02/10/22 196 lb 6.4 oz (89.1 kg)  02/05/22 194 lb (88 kg)  01/13/22 195 lb 8 oz (88.7 kg)    Diabetic Foot Exam - Simple   No data filed    Lab Results  Component Value Date   WBC 10.6 (H) 01/13/2022   HGB 13.3 01/13/2022   HCT 39.4 01/13/2022   PLT 324.0 01/13/2022   GLUCOSE 92 01/13/2022   CHOL 207 (H) 01/13/2022   TRIG 269.0 (H) 01/13/2022   HDL 74.80 01/13/2022   LDLDIRECT 111.0 01/13/2022   LDLCALC 105 (H) 02/14/2019   ALT 21 01/13/2022   AST 19 01/13/2022   NA 134 (L) 01/13/2022   K 4.2 01/13/2022   CL 101 01/13/2022   CREATININE 0.72 01/13/2022   BUN 9 01/13/2022   CO2 22 01/13/2022   TSH 2.27 01/13/2022   HGBA1C 6.2 01/13/2022    Lab Results  Component Value Date   TSH 2.27 01/13/2022   Lab Results  Component Value Date   WBC 10.6 (H) 01/13/2022   HGB 13.3 01/13/2022   HCT 39.4 01/13/2022   MCV 85.6 01/13/2022   PLT 324.0 01/13/2022   Lab Results  Component Value Date   NA 134 (L) 01/13/2022   K 4.2 01/13/2022   CO2 22 01/13/2022   GLUCOSE 92 01/13/2022   BUN 9 01/13/2022   CREATININE 0.72 01/13/2022   BILITOT 0.3 01/13/2022   ALKPHOS 109 01/13/2022   AST 19 01/13/2022   ALT 21 01/13/2022   PROT 6.9 01/13/2022   ALBUMIN 4.1 01/13/2022   CALCIUM 9.4 01/13/2022   ANIONGAP 8 02/13/2016   GFR 95.15 01/13/2022   Lab Results  Component Value Date   CHOL 207 (H) 01/13/2022   Lab Results  Component Value Date   HDL  74.80 01/13/2022   Lab Results  Component Value Date   LDLCALC 105 (H) 02/14/2019   Lab Results  Component Value Date   TRIG 269.0 (H) 01/13/2022   Lab Results  Component Value Date   CHOLHDL 3 01/13/2022   Lab Results  Component Value Date   HGBA1C 6.2 01/13/2022       Assessment & Plan:   Problem List Items Addressed This Visit       Unprioritized   Bronchitis - Primary    Abx per orders  muciniex prn  Cont cough syrup given at uc--- for night time       Relevant Medications   predniSONE (DELTASONE) 10 MG tablet   azithromycin (ZITHROMAX Z-PAK) 250 MG tablet   Other Relevant Orders   DG Chest 2 View   Meds ordered this encounter  Medications   predniSONE (DELTASONE) 10 MG tablet    Sig: TAKE 3 TABLETS PO QD FOR 3 DAYS THEN TAKE 2 TABLETS PO QD FOR 3 DAYS THEN TAKE 1 TABLET PO QD FOR 3 DAYS THEN TAKE 1/2 TAB PO QD FOR 3 DAYS    Dispense:  20 tablet    Refill:  0   azithromycin (ZITHROMAX Z-PAK) 250 MG tablet    Sig: As directed    Dispense:  6 each    Refill:  0   methylPREDNISolone acetate (DEPO-MEDROL) injection 80 mg    I, [employer], DO, personally preformed the services described in this documentation.  All medical record entries made by the scribe were at my direction and in my  presence.  I have reviewed the chart and discharge instructions (if applicable) and agree that the record reflects my personal performance and is accurate and complete. 02/10/2022   I,Amber Collins,acting as a scribe for Ann Held, DO.,have documented all relevant documentation on the behalf of Ann Held, DO,as directed by  Ann Held, DO while in the presence of Ann Held, DO.    Ann Held, DO

## 2022-02-10 NOTE — Assessment & Plan Note (Signed)
Abx per orders  muciniex prn  Cont cough syrup given at uc--- for night time

## 2022-02-12 ENCOUNTER — Ambulatory Visit: Payer: BC Managed Care – PPO | Admitting: Family Medicine

## 2022-05-12 ENCOUNTER — Ambulatory Visit (INDEPENDENT_AMBULATORY_CARE_PROVIDER_SITE_OTHER): Payer: BC Managed Care – PPO | Admitting: Family Medicine

## 2022-05-12 VITALS — BP 120/80 | HR 94 | Temp 97.5°F | Resp 18 | Ht 62.75 in | Wt 192.7 lb

## 2022-05-12 DIAGNOSIS — J029 Acute pharyngitis, unspecified: Secondary | ICD-10-CM | POA: Diagnosis not present

## 2022-05-12 DIAGNOSIS — R519 Headache, unspecified: Secondary | ICD-10-CM | POA: Diagnosis not present

## 2022-05-12 DIAGNOSIS — R5383 Other fatigue: Secondary | ICD-10-CM | POA: Diagnosis not present

## 2022-05-12 DIAGNOSIS — J0140 Acute pansinusitis, unspecified: Secondary | ICD-10-CM | POA: Diagnosis not present

## 2022-05-12 LAB — POCT RAPID STREP A (OFFICE): Rapid Strep A Screen: NEGATIVE

## 2022-05-12 LAB — POC INFLUENZA A&B (BINAX/QUICKVUE)
Influenza A, POC: NEGATIVE
Influenza B, POC: NEGATIVE

## 2022-05-12 LAB — POC COVID19 BINAXNOW: SARS Coronavirus 2 Ag: NEGATIVE

## 2022-05-12 MED ORDER — DOXYCYCLINE HYCLATE 100 MG PO TABS: 100.0000 mg | ORAL_TABLET | Freq: Two times a day (BID) | ORAL | 0 refills | Status: DC

## 2022-05-12 NOTE — Progress Notes (Signed)
Subjective:   By signing my name below, I, Monique Rivers, attest that this documentation has been prepared under the direction and in the presence of Monique Schultz, DO 05/13/22   Patient ID: Monique Rivers, female    DOB: 1965-08-01, 57 y.o.   MRN: [id number]  Chief Complaint  Patient presents with   Headache    Pt states having headache since Friday evening. Pt states her job has had COVID, FLU, Strep. Pt states just having headache, fatigue, and left ear pain. No fever     HPI Patient is in today for a sick visit.  She states that she has been experiencing headaches, fatigue, body aches, mild occasional cough, and left ear pain. Her symptoms started x3 days ago. She had a negative home COVID test but states that it had a 2022 expiration date. She denies any fevers, sore throat, abdominal pain, or N/V/D. She is using Nyquil and OTC cold/sinus medication.   She reports known exposures to COVID, flu, and strep throat at work. She has received her annual flu and COVID boosters.   Past Medical History:  Diagnosis Date   GERD (gastroesophageal reflux disease)     Past Surgical History:  Procedure Laterality Date   LAPAROSCOPIC APPENDECTOMY N/A 02/14/2016   Procedure: APPENDECTOMY LAPAROSCOPIC;  Surgeon: Avel Peace, MD;  Location: [facility];  Service: General;  Laterality: N/A;    Family History  Problem Relation Age of Onset   Diabetes Mother    Hearing loss Mother    Early death Mother    Cancer Father    Hypertension Father    Hyperlipidemia Father    Stroke Father    Miscarriages / [place] Sister    Hearing loss Maternal Grandmother    Hyperlipidemia Maternal Grandmother    Hypertension Maternal Grandmother    Heart disease Maternal Grandmother    Stroke Maternal Grandmother    Heart attack Maternal Grandfather    Early death Maternal Grandfather    Early death Paternal Grandmother    Cancer Paternal Grandmother    Early death Paternal Grandfather     Heart attack Paternal Grandfather    Early death Paternal Aunt    Leukemia Paternal Aunt     Social History   Socioeconomic History   Marital status: Single    Spouse name: Not on file   Number of children: Not on file   Years of education: Not on file   Highest education level: Not on file  Occupational History   Not on file  Tobacco Use   Smoking status: Never   Smokeless tobacco: Never  Vaping Use   Vaping Use: Never used  Substance and Sexual Activity   Alcohol use: Yes    Comment: rarely   Drug use: Never   Sexual activity: Not on file  Other Topics Concern   Not on file  Social History Narrative   Not on file   Social Determinants of Health   Financial Resource Strain: Not on file  Food Insecurity: Not on file  Transportation Needs: Not on file  Physical Activity: Not on file  Stress: Not on file  Social Connections: Not on file  Intimate Partner Violence: Not on file    Outpatient Medications Prior to Visit  Medication Sig Dispense Refill   CALCIUM PO Take 1 tablet by mouth daily.     cetirizine (ZYRTEC) 10 MG tablet Take 10 mg by mouth daily.     fluticasone (FLONASE) 50 MCG/ACT nasal spray Place  1 spray into both nostrils daily.     LORYNA 3-0.02 MG tablet Take 1 tablet by mouth daily.     Multiple Vitamin (MULTIVITAMIN WITH MINERALS) TABS tablet Take 1 tablet by mouth daily.     naphazoline (NAPHCON) 0.1 % ophthalmic solution Place 1 drop into both eyes daily as needed.     pantoprazole (PROTONIX) 40 MG tablet Take 40 mg by mouth daily.     Pseudoeph-Doxylamine-DM-APAP (DAYQUIL/NYQUIL COLD/FLU RELIEF PO) Take by mouth.     azithromycin (ZITHROMAX Z-PAK) 250 MG tablet As directed 6 each 0   predniSONE (DELTASONE) 10 MG tablet TAKE 3 TABLETS PO QD FOR 3 DAYS THEN TAKE 2 TABLETS PO QD FOR 3 DAYS THEN TAKE 1 TABLET PO QD FOR 3 DAYS THEN TAKE 1/2 TAB PO QD FOR 3 DAYS 20 tablet 0   promethazine-dextromethorphan (PROMETHAZINE-DM) 6.25-15 MG/5ML syrup Take 5 mLs  by mouth 2 (two) times daily as needed for cough. 118 mL 0   fexofenadine (ALLEGRA ALLERGY) 180 MG tablet Take 1 tablet (180 mg total) by mouth daily for 15 days. 15 tablet 0   No facility-administered medications prior to visit.    Allergies  Allergen Reactions   Amoxicillin Nausea And Vomiting    Has patient had a PCN reaction causing immediate rash, facial/tongue/throat swelling, SOB or lightheadedness with hypotension: No Has patient had a PCN reaction causing severe rash involving mucus membranes or skin necrosis: No Has patient had a PCN reaction that required hospitalization: No Has patient had a PCN reaction occurring within the last 10 years: No If all of the above answers are "NO", then may proceed with Cephalosporin use.    Hydrocodone-Acetaminophen Other (See Comments)    "made me crazy"   Penicillins    Kenac  [Triamcinolone] Rash    Review of Systems  Constitutional:  Positive for malaise/fatigue. Negative for fever.  HENT:  Positive for ear pain. Negative for congestion, sinus pain and sore throat.   Respiratory:  Negative for cough, shortness of breath and wheezing.   Cardiovascular:  Negative for chest pain and palpitations.  Gastrointestinal:  Negative for abdominal pain, constipation, diarrhea, nausea and vomiting.  Genitourinary:  Negative for dysuria, frequency and hematuria.  Neurological:  Positive for headaches.       Objective:    Physical Exam Constitutional:      General: She is not in acute distress.    Appearance: Normal appearance. She is not ill-appearing.  HENT:     Head: Normocephalic and atraumatic.     Right Ear: Tympanic membrane and external ear normal.     Left Ear: Tympanic membrane and external ear normal.     Mouth/Throat:     Pharynx: Oropharynx is clear. Uvula midline.  Eyes:     Extraocular Movements: Extraocular movements intact.     Pupils: Pupils are equal, round, and reactive to light.  Cardiovascular:     Rate and  Rhythm: Normal rate and regular rhythm.     Heart sounds: Normal heart sounds. No murmur heard.    No gallop.  Pulmonary:     Effort: Pulmonary effort is normal. No respiratory distress.     Breath sounds: Normal breath sounds. No wheezing or rales.  Skin:    General: Skin is warm and dry.  Neurological:     Mental Status: She is alert and oriented to person, place, and time.  Psychiatric:        Judgment: Judgment normal.     BP 120/80 (  BP Location: Left Arm, Patient Position: Sitting, Cuff Size: Large)   Pulse 94   Temp (!) 97.5 F (36.4 C) (Oral)   Resp 18   Ht 5' 2.75" (1.594 m)   Wt 192 lb 11.2 oz (87.4 kg)   SpO2 97%   BMI 34.41 kg/m  Wt Readings from Last 3 Encounters:  05/12/22 192 lb 11.2 oz (87.4 kg)  02/10/22 196 lb 6.4 oz (89.1 kg)  02/05/22 194 lb (88 kg)       Assessment & Plan:  Other fatigue -     POC COVID-19 BinaxNow -     POC Influenza A&B(BINAX/QUICKVUE) -     POCT rapid strep A  Acute nonintractable headache, unspecified headache type -     POC COVID-19 BinaxNow -     POC Influenza A&B(BINAX/QUICKVUE)  Sore throat -     POCT rapid strep A  Acute non-recurrent pansinusitis -     Doxycycline Hyclate; Take 1 tablet (100 mg total) by mouth 2 (two) times daily.  Dispense: 20 tablet; Refill: 0     I,Alexis Herring,acting as a scribe for [employer], DO.,have documented all relevant documentation on the behalf of Ann Held, DO,as directed by  Ann Held, DO while in the presence of [place], DO, personally preformed the services described in this documentation.  All medical record entries made by the scribe were at my direction and in my presence.  I have reviewed the chart and discharge instructions (if applicable) and agree that the record reflects my personal performance and is accurate and complete. 05/13/22   Ann Held, DO

## 2022-05-13 ENCOUNTER — Encounter: Payer: Self-pay | Admitting: Family Medicine

## 2022-07-16 ENCOUNTER — Ambulatory Visit: Payer: BC Managed Care – PPO | Admitting: Family Medicine

## 2022-08-14 ENCOUNTER — Ambulatory Visit: Payer: BC Managed Care – PPO | Admitting: Family Medicine

## 2022-09-16 ENCOUNTER — Telehealth: Payer: Self-pay | Admitting: Family Medicine

## 2022-09-16 ENCOUNTER — Encounter (INDEPENDENT_AMBULATORY_CARE_PROVIDER_SITE_OTHER): Payer: BC Managed Care – PPO | Admitting: Family Medicine

## 2022-09-16 DIAGNOSIS — U071 COVID-19: Secondary | ICD-10-CM | POA: Diagnosis not present

## 2022-09-16 MED ORDER — NIRMATRELVIR/RITONAVIR (PAXLOVID)TABLET: 3.0000 | ORAL_TABLET | Freq: Two times a day (BID) | ORAL | 0 refills | Status: AC

## 2022-09-16 NOTE — Telephone Encounter (Signed)
Please see the MyChart message reply(ies) for my assessment and plan.  The patient gave consent for this Medical Advice Message and is aware that it may result in a bill to their insurance company as well as the possibility that this may result in a co-payment or deductible. They are an established patient, but are not seeking medical advice exclusively about a problem treated during an in person or video visit in the last 7 days. I did not recommend an in person or video visit within 7 days of my reply.  I spent a total of 10 minutes cumulative time within 7 days through MyChart messaging Jermane Brayboy, MD  

## 2022-09-16 NOTE — Addendum Note (Signed)
Addended by: Abbe Amsterdam C on: 09/16/2022 01:30 PM   Modules accepted: Orders

## 2022-09-16 NOTE — Telephone Encounter (Signed)
Please see below.

## 2022-09-16 NOTE — Telephone Encounter (Signed)
Pt will send a MyChart message. She would like Rx to go to [facility] [address], [place], [place] [address]

## 2022-09-16 NOTE — Telephone Encounter (Signed)
Patient called and tested positive for COVID and would like to know what provider can prescribe for congestion, cough and fatigue. Please advise.

## 2022-10-04 NOTE — Progress Notes (Unsigned)
[facility] at [facility] [address], [address] [place], [place] [address] [phone]  Date:  10/09/2022   Name:  Monique Rivers   DOB:  27-Oct-1965   MRN:  [id number]  PCP:  Pearline Cables, MD    Chief Complaint: No chief complaint on file.   History of Present Illness:  Monique Rivers is a 57 y.o. 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  Single, no children  Pap per her GYN-  Mammo 08/07/21 Colon 2019  Labs: full panel done in October  Lab Results  Component Value Date   HGBA1C 6.2 01/13/2022     Patient Active Problem List   Diagnosis Date Noted   Bronchitis 02/10/2022   Prediabetes 02/14/2019   Acute appendicitis 02/13/2016    Past Medical History:  Diagnosis Date   GERD (gastroesophageal reflux disease)     Past Surgical History:  Procedure Laterality Date   LAPAROSCOPIC APPENDECTOMY N/A 02/14/2016   Procedure: APPENDECTOMY LAPAROSCOPIC;  Surgeon: Avel Peace, MD;  Location: [facility];  Service: General;  Laterality: N/A;    Social History   Tobacco Use   Smoking status: Never   Smokeless tobacco: Never  Vaping Use   Vaping Use: Never used  Substance Use Topics   Alcohol use: Yes    Comment: rarely   Drug use: Never    Family History  Problem Relation Age of Onset   Diabetes Mother    Hearing loss Mother    Early death Mother    Cancer Father    Hypertension Father    Hyperlipidemia Father    Stroke Father    Miscarriages / [place] Sister    Hearing loss Maternal Grandmother    Hyperlipidemia Maternal Grandmother    Hypertension Maternal Grandmother    Heart disease Maternal Grandmother    Stroke Maternal Grandmother    Heart attack Maternal  Grandfather    Early death Maternal Grandfather    Early death Paternal Grandmother    Cancer Paternal Grandmother    Early death Paternal Grandfather    Heart attack Paternal Grandfather    Early death Paternal Aunt    Leukemia Paternal Aunt     Allergies  Allergen Reactions   Amoxicillin Nausea And Vomiting    Has patient had a PCN reaction causing immediate rash, facial/tongue/throat swelling, SOB or lightheadedness with hypotension: No Has patient had a PCN reaction causing severe rash involving mucus membranes or skin necrosis: No Has patient had a PCN reaction that required hospitalization: No Has patient had a PCN reaction occurring within the last 10 years: No If all of the above answers are "NO", then may proceed with Cephalosporin use.    Hydrocodone-Acetaminophen Other (See Comments)    "made me crazy"   Penicillins    Kenac  [Triamcinolone] Rash    Medication list has been reviewed and updated.  Current Outpatient Medications on File Prior to Visit  Medication Sig Dispense Refill   CALCIUM PO Take 1 tablet by mouth daily.     cetirizine (ZYRTEC) 10 MG tablet Take 10 mg by mouth daily.     doxycycline (VIBRA-TABS) 100 MG tablet Take 1 tablet (100 mg total) by mouth 2 (  two) times daily. 20 tablet 0   fexofenadine (ALLEGRA ALLERGY) 180 MG tablet Take 1 tablet (180 mg total) by mouth daily for 15 days. 15 tablet 0   fluticasone (FLONASE) 50 MCG/ACT nasal spray Place 1 spray into both nostrils daily.     LORYNA 3-0.02 MG tablet Take 1 tablet by mouth daily.     Multiple Vitamin (MULTIVITAMIN WITH MINERALS) TABS tablet Take 1 tablet by mouth daily.     naphazoline (NAPHCON) 0.1 % ophthalmic solution Place 1 drop into both eyes daily as needed.     pantoprazole (PROTONIX) 40 MG tablet Take 40 mg by mouth daily.     Pseudoeph-Doxylamine-DM-APAP (DAYQUIL/NYQUIL COLD/FLU RELIEF PO) Take by mouth.     No current facility-administered medications on file prior to visit.     Review of Systems:  As per HPI- otherwise negative.   Physical Examination: There were no vitals filed for this visit. There were no vitals filed for this visit. There is no height or weight on file to calculate BMI. Ideal Body Weight:    GEN: no acute distress. HEENT: Atraumatic, Normocephalic.  Ears and Nose: No external deformity. CV: RRR, No M/G/R. No JVD. No thrill. No extra heart sounds. PULM: CTA B, no wheezes, crackles, rhonchi. No retractions. No resp. distress. No accessory muscle use. ABD: S, NT, ND, +BS. No rebound. No HSM. EXTR: No c/c/e PSYCH: Normally interactive. Conversant.    Assessment and Plan: ***  Signed Abbe Amsterdam, MD

## 2022-10-04 NOTE — Patient Instructions (Incomplete)
It was great to see you again today!    Take care, please see me for your physical in about 6 months  I will be in touch with your labs Have a wonderful time in [place]!

## 2022-10-09 ENCOUNTER — Encounter: Payer: Self-pay | Admitting: Family Medicine

## 2022-10-09 ENCOUNTER — Ambulatory Visit (INDEPENDENT_AMBULATORY_CARE_PROVIDER_SITE_OTHER): Payer: BC Managed Care – PPO | Admitting: Family Medicine

## 2022-10-09 VITALS — BP 118/72 | HR 88 | Temp 97.9°F | Resp 18 | Ht 62.75 in | Wt 195.8 lb

## 2022-10-09 DIAGNOSIS — R7303 Prediabetes: Secondary | ICD-10-CM | POA: Diagnosis not present

## 2022-10-09 DIAGNOSIS — D72829 Elevated white blood cell count, unspecified: Secondary | ICD-10-CM | POA: Diagnosis not present

## 2022-10-09 LAB — BASIC METABOLIC PANEL
BUN: 8 mg/dL (ref 6–23)
CO2: 28 mEq/L (ref 19–32)
Calcium: 9.5 mg/dL (ref 8.4–10.5)
Chloride: 102 mEq/L (ref 96–112)
Creatinine, Ser: 0.67 mg/dL (ref 0.40–1.20)
GFR: 98.95 mL/min (ref >60.00)
Glucose, Bld: 103 mg/dL — ABNORMAL HIGH (ref 70–99)
Potassium: 4.5 mEq/L (ref 3.5–5.1)
Sodium: 139 mEq/L (ref 135–145)

## 2022-10-09 LAB — CBC
HCT: 41.8 % (ref 36.0–46.0)
Hemoglobin: 13.6 g/dL (ref 12.0–15.0)
MCHC: 32.6 g/dL (ref 30.0–36.0)
MCV: 84.6 fl (ref 78.0–100.0)
Platelets: 378 10*3/uL (ref 150.0–400.0)
RBC: 4.94 Mil/uL (ref 3.87–5.11)
RDW: 13.2 % (ref 11.5–15.5)
WBC: 9 10*3/uL (ref 4.0–10.5)

## 2022-10-09 LAB — HEMOGLOBIN A1C: Hgb A1c MFr Bld: 5.9 % (ref 4.6–6.5)

## 2022-10-10 LAB — ABO AND RH

## 2023-01-26 ENCOUNTER — Ambulatory Visit: Payer: BC Managed Care – PPO | Admitting: Family Medicine

## 2023-02-18 ENCOUNTER — Encounter: Payer: BC Managed Care – PPO | Admitting: Family Medicine

## 2023-03-19 NOTE — Patient Instructions (Addendum)
It was good to see you again today, I will be in touch with your blood work asap  We would like to start you on a GLP-1 drug, Zepbound for weight loss.  Start with 2.5 mg weekly.  We can go up incrementally once a month as needed and tolerated.  Most common side effects would be nausea, constipation.  Let me know how this works for you and if your insurance will cover it

## 2023-03-19 NOTE — Progress Notes (Signed)
[facility] [address] [place], [place] [address] [phone]  Date:  03/23/2023   Name:  Monique Rivers   DOB:  15-Sep-1965   MRN:  [id number]  PCP:  Pearline Cables, MD    Chief Complaint: Annual Exam (Concerns/ questions: pt says she started feeling bad yesterday- sore throat, HA she is going to [place] on Friday. /Flu shot today: September 2024/)   History of Present Illness:  Monique Rivers is a 57 y.o. 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  Single, no children  She has gynecology care She just had an upper and lower GI in October Mammogram- UTD Blood work done in [employer], CBC, A1c only  Flu vaccine- done COVID booster- done  Shingrix is complete  Pt notes that yesterday she felt really tired, took a long nap Headache, ears popping, sore throat   Pt notes she has been working with a weight loss coach and not having any luck  Patient Active Problem List   Diagnosis Date Noted   Bronchitis 02/10/2022   Prediabetes 02/14/2019   Acute appendicitis 02/13/2016    Past Medical History:  Diagnosis Date   GERD (gastroesophageal reflux disease)     Past Surgical History:  Procedure Laterality Date   LAPAROSCOPIC APPENDECTOMY N/A 02/14/2016   Procedure: APPENDECTOMY LAPAROSCOPIC;  Surgeon: Avel Peace, MD;  Location: [facility];  Service: General;  Laterality: N/A;    Social History   Tobacco Use   Smoking status: Never   Smokeless tobacco: Never  Vaping Use   Vaping status: Never Used  Substance Use Topics   Alcohol use: Yes    Comment: rarely   Drug use: Never    Family History  Problem Relation Age of Onset   Diabetes Mother    Hearing loss Mother    Early death Mother    Cancer Father     Hypertension Father    Hyperlipidemia Father    Stroke Father    Miscarriages / [place] Sister    Hearing loss Maternal Grandmother    Hyperlipidemia Maternal Grandmother    Hypertension Maternal Grandmother    Heart disease Maternal Grandmother    Stroke Maternal Grandmother    Heart attack Maternal Grandfather    Early death Maternal Grandfather    Early death Paternal Grandmother    Cancer Paternal Grandmother    Early death Paternal Grandfather    Heart attack Paternal Grandfather    Early death Paternal Aunt    Leukemia Paternal Aunt     Allergies  Allergen Reactions   Amoxicillin Nausea And Vomiting    Has patient had a PCN reaction causing immediate rash, facial/tongue/throat swelling, SOB or lightheadedness with hypotension: No Has patient had a PCN reaction causing severe rash involving mucus membranes or skin necrosis: No Has patient had a PCN reaction that required hospitalization: No Has patient had a PCN reaction occurring within the last 10 years: No If all of the above answers are "NO", then may proceed with Cephalosporin use.    Hydrocodone-Acetaminophen Other (See Comments)    "made me crazy"   Penicillins    Kenac  [Triamcinolone] Rash    Medication list has been reviewed and updated.  Current Outpatient Medications on File Prior to Visit  Medication Sig Dispense Refill   CALCIUM PO Take 1 tablet by mouth daily.     cetirizine (ZYRTEC) 10 MG tablet Take 10 mg by mouth daily.     fluticasone (FLONASE) 50 MCG/ACT nasal spray Place 1 spray into both nostrils daily.     LORYNA 3-0.02 MG tablet Take 1 tablet by mouth daily.     Multiple Vitamin (MULTIVITAMIN WITH MINERALS) TABS tablet Take 1 tablet by mouth daily.     naphazoline (NAPHCON) 0.1 % ophthalmic solution Place 1 drop into both eyes daily as needed.     pantoprazole (PROTONIX) 40 MG tablet Take 40 mg by mouth daily.     Pseudoeph-Doxylamine-DM-APAP (DAYQUIL/NYQUIL COLD/FLU RELIEF PO) Take by  mouth.     fexofenadine (ALLEGRA ALLERGY) 180 MG tablet Take 1 tablet (180 mg total) by mouth daily for 15 days. 15 tablet 0   No current facility-administered medications on file prior to visit.    Review of Systems:  As per HPI- otherwise negative.   Physical Examination: Vitals:   03/23/23 0809  BP: 124/80  Pulse: 93  Resp: 18  Temp: 97.8 F (36.6 C)  SpO2: 98%   Vitals:   03/23/23 0809  Weight: 196 lb 9.6 oz (89.2 kg)  Height: 5' 2.75" (1.594 m)   Body mass index is 35.1 kg/m. Ideal Body Weight: Weight in (lb) to have BMI = 25: 139.7  GEN: no acute distress.  Obese, looks well  HEENT: Atraumatic, Normocephalic.  Bilateral TM wnl, oropharynx normal.  PEERL,EOMI.   Ears and Nose: No external deformity.  CV: RRR, No M/G/R. No JVD. No thrill. No extra heart sounds. PULM: CTA B, no wheezes, crackles, rhonchi. No retractions. No resp. distress. No accessory muscle use. ABD: S, NT, ND, +BS. No rebound. No HSM. EXTR: No c/c/e PSYCH: Normally interactive. Conversant.   Results for orders placed or performed in visit on 03/23/23  POC COVID-19 BinaxNow  Result Value Ref Range   SARS Coronavirus 2 Ag Negative Negative    Assessment and Plan: Physical exam  Prediabetes - Plan: Comprehensive metabolic panel, Hemoglobin A1c  Screening for thyroid disorder - Plan: TSH  Screening for hyperlipidemia - Plan: Lipid panel  Screening for deficiency anemia - Plan: CBC  Obesity (BMI 35.0-39.9 without comorbidity) - Plan: tirzepatide (ZEPBOUND) 2.5 MG/0.5ML injection vial  Body aches - Plan: POC COVID-19 BinaxNow  Physical exam today.  Encouraged healthy diet and exercise routine Will plan further follow- up pending labs. She is interested in starting a GLP-1 drug for weight loss.  No contraindication.  Will have her start on Zepbound 2.5 mg assuming insurance will cover.  I have asked her let me know how this works for her  She notes mild symptoms of illness.  Rapid COVID  is negative.  Most likely viral upper respiratory infection, she will monitor her progress and keep me posted  Signed Abbe Amsterdam, MD  Received her labs as below- message to pt  Results for orders placed or performed in visit on 03/23/23  CBC  Result Value Ref Range   WBC 9.2 4.0 - 10.5 K/uL   RBC 4.79 3.87 - 5.11 Mil/uL   Platelets 390.0 150.0 - 400.0 K/uL   Hemoglobin 13.5 12.0 - 15.0 g/dL   HCT 40.9 81.1 - 91.4 %   MCV 86.0 78.0 - 100.0 fl   MCHC 32.8 30.0 - 36.0 g/dL   RDW 78.2 95.6 - 21.3 %  Comprehensive metabolic panel  Result Value Ref Range  Sodium 137 135 - 145 mEq/L   Potassium 5.0 3.5 - 5.1 mEq/L   Chloride 103 96 - 112 mEq/L   CO2 23 19 - 32 mEq/L   Glucose, Bld 105 (H) 70 - 99 mg/dL   BUN 8 6 - 23 mg/dL   Creatinine, Ser 3.66 0.40 - 1.20 mg/dL   Total Bilirubin 0.3 0.2 - 1.2 mg/dL   Alkaline Phosphatase 109 39 - 117 U/L   AST 17 0 - 37 U/L   ALT 17 0 - 35 U/L   Total Protein 6.7 6.0 - 8.3 g/dL   Albumin 4.0 3.5 - 5.2 g/dL   GFR 44.03 >47.42 mL/min   Calcium 9.5 8.4 - 10.5 mg/dL  Hemoglobin V9D  Result Value Ref Range   Hgb A1c MFr Bld 6.2 4.6 - 6.5 %  Lipid panel  Result Value Ref Range   Cholesterol 220 (H) 0 - 200 mg/dL   Triglycerides 638.7 (H) 0.0 - 149.0 mg/dL   HDL 56.43 >32.95 mg/dL   VLDL 18.8 0.0 - 41.6 mg/dL   LDL Cholesterol 606 (H) 0 - 99 mg/dL   Total CHOL/HDL Ratio 3    NonHDL 144.57   TSH  Result Value Ref Range   TSH 1.64 0.35 - 5.50 uIU/mL  POC COVID-19 BinaxNow  Result Value Ref Range   SARS Coronavirus 2 Ag Negative Negative

## 2023-03-23 ENCOUNTER — Encounter: Payer: Self-pay | Admitting: Family Medicine

## 2023-03-23 ENCOUNTER — Ambulatory Visit: Payer: BC Managed Care – PPO | Admitting: Family Medicine

## 2023-03-23 VITALS — BP 124/80 | HR 93 | Temp 97.8°F | Resp 18 | Ht 62.75 in | Wt 196.6 lb

## 2023-03-23 DIAGNOSIS — R52 Pain, unspecified: Secondary | ICD-10-CM

## 2023-03-23 DIAGNOSIS — E669 Obesity, unspecified: Secondary | ICD-10-CM

## 2023-03-23 DIAGNOSIS — Z13220 Encounter for screening for lipoid disorders: Secondary | ICD-10-CM | POA: Diagnosis not present

## 2023-03-23 DIAGNOSIS — R7303 Prediabetes: Secondary | ICD-10-CM

## 2023-03-23 DIAGNOSIS — Z1329 Encounter for screening for other suspected endocrine disorder: Secondary | ICD-10-CM

## 2023-03-23 DIAGNOSIS — Z0000 Encounter for general adult medical examination without abnormal findings: Secondary | ICD-10-CM | POA: Diagnosis not present

## 2023-03-23 DIAGNOSIS — Z130 Encounter for screening for diseases of the blood and blood-forming organs and certain disorders involving the immune mechanism: Secondary | ICD-10-CM

## 2023-03-23 LAB — LIPID PANEL
Cholesterol: 220 mg/dL — ABNORMAL HIGH (ref 0–200)
HDL: 75.2 mg/dL (ref >39.00)
LDL Cholesterol: 108 mg/dL — ABNORMAL HIGH (ref 0–99)
NonHDL: 144.57
Total CHOL/HDL Ratio: 3
Triglycerides: 181 mg/dL — ABNORMAL HIGH (ref 0.0–149.0)
VLDL: 36.2 mg/dL (ref 0.0–40.0)

## 2023-03-23 LAB — COMPREHENSIVE METABOLIC PANEL
ALT: 17 U/L (ref 0–35)
AST: 17 U/L (ref 0–37)
Albumin: 4 g/dL (ref 3.5–5.2)
Alkaline Phosphatase: 109 U/L (ref 39–117)
BUN: 8 mg/dL (ref 6–23)
CO2: 23 meq/L (ref 19–32)
Calcium: 9.5 mg/dL (ref 8.4–10.5)
Chloride: 103 meq/L (ref 96–112)
Creatinine, Ser: 0.68 mg/dL (ref 0.40–1.20)
GFR: 98.29 mL/min (ref >60.00)
Glucose, Bld: 105 mg/dL — ABNORMAL HIGH (ref 70–99)
Potassium: 5 meq/L (ref 3.5–5.1)
Sodium: 137 meq/L (ref 135–145)
Total Bilirubin: 0.3 mg/dL (ref 0.2–1.2)
Total Protein: 6.7 g/dL (ref 6.0–8.3)

## 2023-03-23 LAB — CBC
HCT: 41.1 % (ref 36.0–46.0)
Hemoglobin: 13.5 g/dL (ref 12.0–15.0)
MCHC: 32.8 g/dL (ref 30.0–36.0)
MCV: 86 fL (ref 78.0–100.0)
Platelets: 390 10*3/uL (ref 150.0–400.0)
RBC: 4.79 Mil/uL (ref 3.87–5.11)
RDW: 13.5 % (ref 11.5–15.5)
WBC: 9.2 10*3/uL (ref 4.0–10.5)

## 2023-03-23 LAB — HEMOGLOBIN A1C: Hgb A1c MFr Bld: 6.2 % (ref 4.6–6.5)

## 2023-03-23 LAB — POC COVID19 BINAXNOW: SARS Coronavirus 2 Ag: NEGATIVE

## 2023-03-23 LAB — TSH: TSH: 1.64 u[IU]/mL (ref 0.35–5.50)

## 2023-03-23 MED ORDER — TIRZEPATIDE-WEIGHT MANAGEMENT 2.5 MG/0.5ML ~~LOC~~ SOLN: 2.5000 mg | SUBCUTANEOUS | 1 refills | Status: DC

## 2023-03-25 ENCOUNTER — Telehealth: Payer: Self-pay

## 2023-03-25 NOTE — Telephone Encounter (Signed)
Monique Rivers (Key: [id number])  Approved today by [employer] 2017 CaseId:[id number];Status:Approved;Review Type:Prior Auth;Coverage Start Date:02/23/2023;Coverage End Date:11/20/2023;

## 2023-05-04 NOTE — Telephone Encounter (Signed)
Error

## 2023-05-06 ENCOUNTER — Encounter: Payer: Self-pay | Admitting: Family Medicine

## 2023-05-06 ENCOUNTER — Telehealth: Payer: Self-pay

## 2023-05-06 DIAGNOSIS — E669 Obesity, unspecified: Secondary | ICD-10-CM

## 2023-05-06 MED ORDER — ZEPBOUND 5 MG/0.5ML ~~LOC~~ SOAJ: 5.0000 mg | SUBCUTANEOUS | 0 refills | Status: DC

## 2023-05-06 MED ORDER — TIRZEPATIDE-WEIGHT MANAGEMENT 5 MG/0.5ML ~~LOC~~ SOLN: 5.0000 mg | SUBCUTANEOUS | 1 refills | Status: DC

## 2023-05-06 NOTE — Addendum Note (Signed)
Addended by: Abbe Amsterdam C on: 05/06/2023 12:12 PM   Modules accepted: Orders

## 2023-05-06 NOTE — Telephone Encounter (Signed)
Pt is okay with dose increase.

## 2023-05-06 NOTE — Telephone Encounter (Signed)
Reply sent to the pt via Mychart

## 2023-05-06 NOTE — Addendum Note (Signed)
Addended byConrad [place] D on: 05/06/2023 01:09 PM   Modules accepted: Orders

## 2023-05-06 NOTE — Telephone Encounter (Signed)
Tried initiating PA via covermymeds; KEY: [id number].   Informed that clinical override isn't needed   She will need to move to next dose of 5mg .

## 2023-05-26 ENCOUNTER — Encounter: Payer: Self-pay | Admitting: Family Medicine

## 2023-05-29 MED ORDER — ZEPBOUND 7.5 MG/0.5ML ~~LOC~~ SOAJ: 7.5000 mg | SUBCUTANEOUS | 1 refills | Status: DC

## 2023-05-29 NOTE — Addendum Note (Signed)
Addended by: Pearline Cables on: 05/29/2023 06:32 AM   Modules accepted: Orders

## 2023-06-24 ENCOUNTER — Encounter: Payer: Self-pay | Admitting: Family Medicine

## 2023-06-24 ENCOUNTER — Telehealth: Payer: Self-pay | Admitting: *Deleted

## 2023-06-24 DIAGNOSIS — Z13220 Encounter for screening for lipoid disorders: Secondary | ICD-10-CM

## 2023-06-24 DIAGNOSIS — R7303 Prediabetes: Secondary | ICD-10-CM

## 2023-06-24 NOTE — Telephone Encounter (Signed)
 Patient has appointment coming up on 07/03/23 and we are needing lab orders placed.

## 2023-06-24 NOTE — Addendum Note (Signed)
 Addended by: Abbe Amsterdam C on: 06/24/2023 11:09 AM   Modules accepted: Orders

## 2023-06-24 NOTE — Telephone Encounter (Signed)
 Per Mychart message on 04/20/23: "Oh terrific! Yes please see me in March/ April- we can check on your progress and your labs at that time. Take care, I hope you had a great holiday as well."  Looks like she is supposed to be having and OV correct?

## 2023-06-29 ENCOUNTER — Encounter: Payer: Self-pay | Admitting: Family Medicine

## 2023-06-30 ENCOUNTER — Other Ambulatory Visit: Payer: Self-pay | Admitting: Family Medicine

## 2023-06-30 DIAGNOSIS — E669 Obesity, unspecified: Secondary | ICD-10-CM

## 2023-06-30 MED ORDER — ZEPBOUND 10 MG/0.5ML ~~LOC~~ SOAJ: 10.0000 mg | SUBCUTANEOUS | 2 refills | Status: DC

## 2023-07-03 ENCOUNTER — Encounter: Payer: Self-pay | Admitting: Family Medicine

## 2023-07-03 ENCOUNTER — Other Ambulatory Visit (INDEPENDENT_AMBULATORY_CARE_PROVIDER_SITE_OTHER): Payer: BC Managed Care – PPO

## 2023-07-03 DIAGNOSIS — R7303 Prediabetes: Secondary | ICD-10-CM | POA: Diagnosis not present

## 2023-07-03 DIAGNOSIS — Z13220 Encounter for screening for lipoid disorders: Secondary | ICD-10-CM | POA: Diagnosis not present

## 2023-07-03 LAB — BASIC METABOLIC PANEL
BUN: 9 mg/dL (ref 6–23)
CO2: 24 meq/L (ref 19–32)
Calcium: 9.6 mg/dL (ref 8.4–10.5)
Chloride: 104 meq/L (ref 96–112)
Creatinine, Ser: 0.72 mg/dL (ref 0.40–1.20)
GFR: 94.17 mL/min (ref >60.00)
Glucose, Bld: 81 mg/dL (ref 70–99)
Potassium: 4.5 meq/L (ref 3.5–5.1)
Sodium: 137 meq/L (ref 135–145)

## 2023-07-03 LAB — LIPID PANEL
Cholesterol: 202 mg/dL — ABNORMAL HIGH (ref 0–200)
HDL: 74.7 mg/dL (ref >39.00)
LDL Cholesterol: 89 mg/dL (ref 0–99)
NonHDL: 127.58
Total CHOL/HDL Ratio: 3
Triglycerides: 195 mg/dL — ABNORMAL HIGH (ref 0.0–149.0)
VLDL: 39 mg/dL (ref 0.0–40.0)

## 2023-07-03 LAB — HEMOGLOBIN A1C: Hgb A1c MFr Bld: 5.5 % (ref 4.6–6.5)

## 2023-07-05 NOTE — Progress Notes (Unsigned)
  Healthcare at [facility] [address] [place], [place] [address] [phone]  Date:  07/06/2023   Name:  Monique Rivers   DOB:  1965-08-16   MRN:  [id number]  PCP:  Pearline Cables, MD    Chief Complaint: med f/u, labs (Concerns/ questions: pt says she feels like she is getting cold sores. She wonders if this is med related? /)   History of Present Illness:  Monique Rivers is a 58 y.o. very pleasant female patient who presents with the following:  Patient seen today for medication follow-up Most recent visit with myself was in December for her physical We started Zepbound for weight loss, I recently increased her to the 10 mg strength- she would like to see how she does on this strength for a while prior to going up.  SE are minimal  She is avoiding alcohol Just recently she came and had some blood work; cholesterol, A1c showed improvement  Pt notes her weight is coming down gradually  She plans to do a 1/2 marathon coming up in the spring!  At [facility]  She is getting her mammo set up per GYN   Wt Readings from Last 3 Encounters:  07/06/23 185 lb 3.2 oz (84 kg)  03/23/23 196 lb 9.6 oz (89.2 kg)  10/09/22 195 lb 12.8 oz (88.8 kg)   Her goal is about 140- 160  Patient Active Problem List   Diagnosis Date Noted   Bronchitis 02/10/2022   Prediabetes 02/14/2019   Acute appendicitis 02/13/2016    Past Medical History:  Diagnosis Date   GERD (gastroesophageal reflux disease)     Past Surgical History:  Procedure Laterality Date   LAPAROSCOPIC APPENDECTOMY N/A 02/14/2016   Procedure: APPENDECTOMY LAPAROSCOPIC;  Surgeon: Avel Peace, MD;  Location: [facility];  Service: General;  Laterality: N/A;    Social History   Tobacco Use   Smoking status: Never   Smokeless tobacco: Never  Vaping Use   Vaping status: Never Used  Substance Use Topics   Alcohol use: Yes    Comment: rarely   Drug use: Never    Family History   Problem Relation Age of Onset   Diabetes Mother    Hearing loss Mother    Early death Mother    Cancer Father    Hypertension Father    Hyperlipidemia Father    Stroke Father    Miscarriages / [place] Sister    Hearing loss Maternal Grandmother    Hyperlipidemia Maternal Grandmother    Hypertension Maternal Grandmother    Heart disease Maternal Grandmother    Stroke Maternal Grandmother    Heart attack Maternal Grandfather    Early death Maternal Grandfather    Early death Paternal Grandmother    Cancer Paternal Grandmother    Early death Paternal Grandfather    Heart attack Paternal Grandfather    Early death Paternal Aunt    Leukemia Paternal Aunt     Allergies  Allergen Reactions   Amoxicillin Nausea And Vomiting    Has patient had a PCN reaction causing immediate rash, facial/tongue/throat swelling, SOB or lightheadedness with hypotension: No Has patient had a PCN reaction causing severe rash involving mucus membranes or skin necrosis: No Has patient had a PCN reaction that required hospitalization: No Has patient had a PCN reaction occurring within the last 10 years: No If all of the above answers are "NO", then may proceed with Cephalosporin use.  Hydrocodone-Acetaminophen Other (See Comments)    "made me crazy"   Penicillins    Kenac  [Triamcinolone] Rash    Medication list has been reviewed and updated.  Current Outpatient Medications on File Prior to Visit  Medication Sig Dispense Refill   azithromycin (ZITHROMAX) 250 MG tablet Take 2 tablets (500 mg) on day one followed by, 1 tablet (250 mg) once daily on days 2-5.     CALCIUM PO Take 1 tablet by mouth daily.     cetirizine (ZYRTEC) 10 MG tablet Take 10 mg by mouth daily.     fluticasone (FLONASE) 50 MCG/ACT nasal spray Place 1 spray into both nostrils daily.     LORYNA 3-0.02 MG tablet Take 1 tablet by mouth daily.     Multiple Vitamin (MULTIVITAMIN WITH MINERALS) TABS tablet Take 1 tablet by mouth  daily.     naphazoline (NAPHCON) 0.1 % ophthalmic solution Place 1 drop into both eyes daily as needed.     pantoprazole (PROTONIX) 40 MG tablet Take 40 mg by mouth daily.     Pseudoeph-Doxylamine-DM-APAP (DAYQUIL/NYQUIL COLD/FLU RELIEF PO) Take by mouth.     tirzepatide (ZEPBOUND) 10 MG/0.5ML Pen Inject 10 mg into the skin once a week. 2 mL 2   fexofenadine (ALLEGRA ALLERGY) 180 MG tablet Take 1 tablet (180 mg total) by mouth daily for 15 days. 15 tablet 0   No current facility-administered medications on file prior to visit.    Review of Systems:  As per HPI- otherwise negative.   Physical Examination: Vitals:   07/06/23 0819  BP: 122/80  Pulse: 98  Resp: 18  Temp: 98 F (36.7 C)  SpO2: 98%   Vitals:   07/06/23 0819  Weight: 185 lb 3.2 oz (84 kg)  Height: 5' 2.75" (1.594 m)   Body mass index is 33.07 kg/m. Ideal Body Weight: Weight in (lb) to have BMI = 25: 139.7  GEN: no acute distress.  Obese but has lost, looks well  HEENT: Atraumatic, Normocephalic.  Bilateral TM wnl, oropharynx normal.  PEERL,EOMI.  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 CV: RRR, No M/G/R. No JVD. No thrill. No extra heart sounds. PULM: CTA B, no wheezes, crackles, rhonchi. No retractions. No resp. distress. No accessory muscle use. ABD: S, NT, ND EXTR: No c/c/e PSYCH: Normally interactive. Conversant.    Assessment and Plan: Prediabetes  Obesity (BMI 35.0-39.9 without comorbidity)  Angular cheilitis  A1c shows improvement Doing well, losing weight with zepbound.  She will let me know if she wants to adjust her dosage Treat AC with topical antifungal Recheck virtually in the summer for weight check, we have a CPE scheduled for December Went over labs which were drawn in advance   Signed Abbe Amsterdam, MD

## 2023-07-05 NOTE — Patient Instructions (Incomplete)
 It was good to see you again today, keep up the good work  Please see me in 4-6 months  You have angular celitis- try a topical antifungal cream- this should clear it up in a few days

## 2023-07-06 ENCOUNTER — Ambulatory Visit (INDEPENDENT_AMBULATORY_CARE_PROVIDER_SITE_OTHER): Payer: BC Managed Care – PPO | Admitting: Family Medicine

## 2023-07-06 VITALS — BP 122/80 | HR 98 | Temp 98.0°F | Resp 18 | Ht 62.75 in | Wt 185.2 lb

## 2023-07-06 DIAGNOSIS — K130 Diseases of lips: Secondary | ICD-10-CM | POA: Diagnosis not present

## 2023-07-06 DIAGNOSIS — E669 Obesity, unspecified: Secondary | ICD-10-CM

## 2023-07-06 DIAGNOSIS — R7303 Prediabetes: Secondary | ICD-10-CM

## 2023-07-24 MED ORDER — ZEPBOUND 12.5 MG/0.5ML ~~LOC~~ SOAJ: 12.5000 mg | SUBCUTANEOUS | 1 refills | Status: DC

## 2023-07-24 NOTE — Addendum Note (Signed)
 Addended by: Abbe Amsterdam C on: 07/24/2023 07:20 AM   Modules accepted: Orders

## 2023-10-08 ENCOUNTER — Encounter: Payer: Self-pay | Admitting: Family Medicine

## 2023-10-08 MED ORDER — ZEPBOUND 12.5 MG/0.5ML ~~LOC~~ SOAJ: 12.5000 mg | SUBCUTANEOUS | 1 refills | Status: DC

## 2023-11-03 ENCOUNTER — Other Ambulatory Visit (HOSPITAL_COMMUNITY): Payer: Self-pay

## 2023-11-27 NOTE — Progress Notes (Unsigned)
 [facility] at [facility] [address] [place], [place] [address] [phone]  Date:  12/02/2023   Name:  Monique Rivers   DOB:  [birth year]   MRN:  [id number]  PCP:  Watt Harlene BROCKS, MD    Chief Complaint: No chief complaint on file.   History of Present Illness:  Monique Rivers is a 58 y.o. very pleasant female patient who presents with the following:  Pt seen today to discuss medication Virtual visit today Pt location is her office, I am also at my office.  Patient identity confirmed with 2 factors, she gives consent for virtual visit today.  The patient and myself are present on the call today  I last saw Monique Rivers in March - at that time she was using zepbound  10 mg for weight loss  She is now on the 12.5 mg but wonders if she should go down to 10 mg -she feels like 12.5 is making her feel more bloated and uncomfortable in her abdomen.  Lab Results  Component Value Date   HGBA1C 5.5 07/03/2023   She is at 168lbs She is down 30lbs, but would like to get down another 20 lbs She notes she is losing weight gradually but steadily.  Wt Readings from Last 3 Encounters:  07/06/23 185 lb 3.2 oz (84 kg)  03/23/23 196 lb 9.6 oz (89.2 kg)  10/09/22 195 lb 12.8 oz (88.8 kg)   She plans to run a 1/2 marathon in March!  She is doing her training  Monique Rivers also wonders about her birth control pill versus hormone replacement therapy.  She notes she recently got a menstrual period though her menses are fairly rare at this point.  She is also dealing with frequent urinary tract infection which could indicate vaginal atrophy.  She does have a gynecologist and I encouraged her to reach out to this provider soon as possible  Patient Active Problem List   Diagnosis Date Noted   Bronchitis 02/10/2022   Prediabetes 02/14/2019   Acute appendicitis 02/13/2016    Past Medical History:  Diagnosis Date   GERD (gastroesophageal reflux disease)      Past Surgical History:  Procedure Laterality Date   LAPAROSCOPIC APPENDECTOMY N/A 02/14/2016   Procedure: APPENDECTOMY LAPAROSCOPIC;  Surgeon: Krystal Russell, MD;  Location: [facility];  Service: General;  Laterality: N/A;    Social History   Tobacco Use   Smoking status: Never   Smokeless tobacco: Never  Vaping Use   Vaping status: Never Used  Substance Use Topics   Alcohol use: Yes    Comment: rarely   Drug use: Never    Family History  Problem Relation Age of Onset   Diabetes Mother    Hearing loss Mother    Early death Mother    Cancer Father    Hypertension Father    Hyperlipidemia Father    Stroke Father    Miscarriages / [place] Sister    Hearing loss Maternal Grandmother    Hyperlipidemia Maternal Grandmother    Hypertension Maternal Grandmother    Heart disease Maternal Grandmother    Stroke Maternal Grandmother    Heart attack Maternal Grandfather    Early death Maternal Grandfather    Early death Paternal Grandmother    Cancer Paternal Grandmother    Early death Paternal Grandfather    Heart attack Paternal Grandfather    Early death Paternal Aunt    Leukemia Paternal Aunt  Allergies  Allergen Reactions   Amoxicillin Nausea And Vomiting    Has patient had a PCN reaction causing immediate rash, facial/tongue/throat swelling, SOB or lightheadedness with hypotension: No Has patient had a PCN reaction causing severe rash involving mucus membranes or skin necrosis: No Has patient had a PCN reaction that required hospitalization: No Has patient had a PCN reaction occurring within the last 10 years: No If all of the above answers are NO, then may proceed with Cephalosporin use.    Hydrocodone -Acetaminophen  Other (See Comments)    made me crazy   Penicillins    Kenac  [Triamcinolone] Rash    Medication list has been reviewed and updated.  Current Outpatient Medications on File Prior to Visit  Medication Sig Dispense Refill   azithromycin   (ZITHROMAX ) 250 MG tablet Take 2 tablets (500 mg) on day one followed by, 1 tablet (250 mg) once daily on days 2-5.     CALCIUM PO Take 1 tablet by mouth daily.     cetirizine (ZYRTEC) 10 MG tablet Take 10 mg by mouth daily.     fexofenadine  (ALLEGRA  ALLERGY) 180 MG tablet Take 1 tablet (180 mg total) by mouth daily for 15 days. 15 tablet 0   fluticasone  (FLONASE ) 50 MCG/ACT nasal spray Place 1 spray into both nostrils daily.     LORYNA 3-0.02 MG tablet Take 1 tablet by mouth daily.     Multiple Vitamin (MULTIVITAMIN WITH MINERALS) TABS tablet Take 1 tablet by mouth daily.     naphazoline (NAPHCON) 0.1 % ophthalmic solution Place 1 drop into both eyes daily as needed.     pantoprazole (PROTONIX) 40 MG tablet Take 40 mg by mouth daily.     Pseudoeph-Doxylamine-DM-APAP (DAYQUIL/NYQUIL COLD/FLU RELIEF PO) Take by mouth.     tirzepatide  (ZEPBOUND ) 12.5 MG/0.5ML Pen Inject 12.5 mg into the skin once a week. 6 mL 1   No current facility-administered medications on file prior to visit.    Review of Systems:  As per HPI- otherwise negative.   Physical Examination: There were no vitals filed for this visit. There were no vitals filed for this visit. There is no height or weight on file to calculate BMI. Ideal Body Weight:   Patient observed via MyChart video, she looks well.  She has lost weight.  No shortness of breath or distress   Assessment and Plan: Obesity (BMI 35.0-39.9 without comorbidity) - Plan: tirzepatide  10 MG/0.5ML injection vial  Patient seen today for discussion of medication treated obesity.  She is having good results with weight loss, she is down about 30 pounds from her heaviest and hopes to lose another 20.  She has been using tirzepatide  12.5 but notes increasing side effects on this dosage.  She would like to potentially go back down to 10 mg-this sounds fine to me.  I asked her to let me know if her weight loss stalls out or she starts to gain again.  Signed Harlene Schroeder, MD

## 2023-11-29 ENCOUNTER — Ambulatory Visit
Admission: EM | Admit: 2023-11-29 | Discharge: 2023-11-29 | Disposition: A | Discharge status: Home / Self Care | Attending: Internal Medicine | Admitting: Internal Medicine

## 2023-11-29 ENCOUNTER — Other Ambulatory Visit: Payer: Self-pay

## 2023-11-29 DIAGNOSIS — R3915 Urgency of urination: Secondary | ICD-10-CM | POA: Diagnosis present

## 2023-11-29 DIAGNOSIS — Z79899 Other long term (current) drug therapy: Secondary | ICD-10-CM | POA: Insufficient documentation

## 2023-11-29 DIAGNOSIS — K219 Gastro-esophageal reflux disease without esophagitis: Secondary | ICD-10-CM | POA: Diagnosis not present

## 2023-11-29 DIAGNOSIS — N390 Urinary tract infection, site not specified: Secondary | ICD-10-CM | POA: Insufficient documentation

## 2023-11-29 DIAGNOSIS — R300 Dysuria: Secondary | ICD-10-CM | POA: Diagnosis present

## 2023-11-29 LAB — POCT URINALYSIS DIP (MANUAL ENTRY)
Bilirubin, UA: NEGATIVE
Blood, UA: NEGATIVE
Glucose, UA: NEGATIVE mg/dL
Ketones, POC UA: NEGATIVE mg/dL
Nitrite, UA: NEGATIVE
Protein Ur, POC: NEGATIVE mg/dL
Spec Grav, UA: <=1.005 — AB (ref 1.010–1.025)
Urobilinogen, UA: 0.2 U/dL
pH, UA: 5.5 (ref 5.0–8.0)

## 2023-11-29 MED ORDER — FLUCONAZOLE 150 MG PO TABS
150.0000 mg | ORAL_TABLET | Freq: Every day | ORAL | 0 refills | Status: AC
Start: 2023-11-29 — End: 2023-12-01

## 2023-11-29 MED ORDER — NITROFURANTOIN MONOHYD MACRO 100 MG PO CAPS: 100.0000 mg | ORAL_CAPSULE | Freq: Two times a day (BID) | ORAL | 0 refills | Status: DC

## 2023-11-29 NOTE — Discharge Instructions (Signed)
 Symptoms, physical exam findings and urinalysis all suggest a urinary tract infection.  We will send the urine off for culture to ensure that antibiotic treatment will be adequate.  We will treat with the following: Macrobid  100 mg twice daily for 5 days. This is an antibiotic. If symptoms of yeast infection develop: Diflucan  150 mg take 1 tablet and then repeat in 3 days.  Make sure to stay hydrated by drinking plenty of water.  Return to urgent care or PCP if symptoms worsen or fail to resolve.

## 2023-11-29 NOTE — ED Provider Notes (Signed)
 Monique Rivers    CSN: [id number] Arrival date & time: 11/29/23  1020      History   Chief Complaint Chief Complaint  Patient presents with   Dysuria    HPI Monique Rivers is a 58 y.o. female.   58 y.o. female who presents to urgent Rivers with complaints of Urinary urgency: Patient complains of urgency She has had symptoms for several hours. Patient denies back pain, fever, stomach ache, and vaginal discharge. Patient does not have a history of recurrent UTI but has had several in the past since starting zepbound . Patient does not have a history of pyelonephritis.    Dysuria Associated symptoms: no abdominal pain, no fever and no vomiting     Past Medical History:  Diagnosis Date   GERD (gastroesophageal reflux disease)     Patient Active Problem List   Diagnosis Date Noted   Bronchitis 02/10/2022   Prediabetes 02/14/2019   Acute appendicitis 02/13/2016    Past Surgical History:  Procedure Laterality Date   LAPAROSCOPIC APPENDECTOMY N/A 02/14/2016   Procedure: APPENDECTOMY LAPAROSCOPIC;  Surgeon: Krystal Russell, MD;  Location: [facility];  Service: General;  Laterality: N/A;    OB History   No obstetric history on file.      Home Medications    Prior to Admission medications   Medication Sig Start Date End Date Taking? Authorizing Provider  fluconazole  (DIFLUCAN ) 150 MG tablet Take 1 tablet (150 mg total) by mouth daily for 2 days. take 1 tablet and then repeat in 3 days 11/29/23 12/01/23 Yes Khloi Rawl, Almarie LABOR, PA-C  nitrofurantoin , macrocrystal-monohydrate, (MACROBID ) 100 MG capsule Take 1 capsule (100 mg total) by mouth 2 (two) times daily. 11/29/23  Yes Jameir Ake A, PA-C  azithromycin  (ZITHROMAX ) 250 MG tablet Take 2 tablets (500 mg) on day one followed by, 1 tablet (250 mg) once daily on days 2-5. 06/19/23   [provider]  CALCIUM PO Take 1 tablet by mouth daily.    [provider]  cetirizine (ZYRTEC) 10 MG tablet Take 10 mg by  mouth daily.    [provider]  fexofenadine  (ALLEGRA  ALLERGY) 180 MG tablet Take 1 tablet (180 mg total) by mouth daily for 15 days. 08/21/21 02/05/22  Teddy Sharper, FNP  fluticasone  (FLONASE ) 50 MCG/ACT nasal spray Place 1 spray into both nostrils daily.    [provider]  LORYNA 3-0.02 MG tablet Take 1 tablet by mouth daily. 01/23/16   [provider]  Multiple Vitamin (MULTIVITAMIN WITH MINERALS) TABS tablet Take 1 tablet by mouth daily.    [provider]  naphazoline (NAPHCON) 0.1 % ophthalmic solution Place 1 drop into both eyes daily as needed.    [provider]  pantoprazole (PROTONIX) 40 MG tablet Take 40 mg by mouth daily. 11/28/15   [provider]  Pseudoeph-Doxylamine-DM-APAP (DAYQUIL/NYQUIL COLD/FLU RELIEF PO) Take by mouth.    [provider]  tirzepatide  (ZEPBOUND ) 12.5 MG/0.5ML Pen Inject 12.5 mg into the skin once a week. 10/08/23   Copland, Harlene BROCKS, MD    Family History Family History  Problem Relation Age of Onset   Diabetes Mother    Hearing loss Mother    Early death Mother    Cancer Father    Hypertension Father    Hyperlipidemia Father    Stroke Father    Miscarriages / [place] Sister    Hearing loss Maternal Grandmother    Hyperlipidemia Maternal Grandmother    Hypertension Maternal Grandmother    Heart  disease Maternal Grandmother    Stroke Maternal Grandmother    Heart attack Maternal Grandfather    Early death Maternal Grandfather    Early death Paternal Grandmother    Cancer Paternal Grandmother    Early death Paternal Grandfather    Heart attack Paternal Grandfather    Early death Paternal Aunt    Leukemia Paternal Aunt     Social History Social History   Tobacco Use   Smoking status: Never   Smokeless tobacco: Never  Vaping Use   Vaping status: Never Used  Substance Use Topics   Alcohol use: Yes    Comment: rarely   Drug use: Never     Allergies   Amoxicillin,  Hydrocodone -acetaminophen , Penicillins, and Kenac  [triamcinolone]   Review of Systems Review of Systems  Constitutional:  Negative for chills and fever.  HENT:  Negative for ear pain and sore throat.   Eyes:  Negative for pain and visual disturbance.  Respiratory:  Negative for cough and shortness of breath.   Cardiovascular:  Negative for chest pain and palpitations.  Gastrointestinal:  Negative for abdominal pain and vomiting.  Genitourinary:  Positive for urgency. Negative for dysuria and hematuria.  Musculoskeletal:  Negative for arthralgias and back pain.  Skin:  Negative for color change and rash.  Neurological:  Negative for seizures and syncope.  All other systems reviewed and are negative.    Physical Exam Triage Vital Signs ED Triage Vitals  Encounter Vitals Group     BP 11/29/23 1022 137/85     Girls Systolic BP Percentile --      Girls Diastolic BP Percentile --      Boys Systolic BP Percentile --      Boys Diastolic BP Percentile --      Pulse Rate 11/29/23 1022 82     Resp 11/29/23 1022 16     Temp 11/29/23 1022 97.8 F (36.6 C)     Temp src --      SpO2 11/29/23 1022 97 %     Weight --      Height --      Head Circumference --      Peak Flow --      Pain Score 11/29/23 1025 1     Pain Loc --      Pain Education --      Exclude from Growth Chart --    No data found.  Updated Vital Signs BP 137/85   Pulse 82   Temp 97.8 F (36.6 C)   Resp 16   LMP  (LMP Unknown)   SpO2 97%   Visual Acuity Right Eye Distance:   Left Eye Distance:   Bilateral Distance:    Right Eye Near:   Left Eye Near:    Bilateral Near:     Physical Exam Vitals and nursing note reviewed.  Constitutional:      General: She is not in acute distress.    Appearance: She is well-developed.  HENT:     Head: Normocephalic and atraumatic.  Eyes:     Conjunctiva/sclera: Conjunctivae normal.  Cardiovascular:     Rate and Rhythm: Normal rate and regular rhythm.     Heart  sounds: No murmur heard. Pulmonary:     Effort: Pulmonary effort is normal. No respiratory distress.     Breath sounds: Normal breath sounds.  Abdominal:     Palpations: Abdomen is soft.     Tenderness: There is no abdominal tenderness.  Musculoskeletal:  General: No swelling.     Cervical back: Neck supple.  Skin:    General: Skin is warm and dry.     Capillary Refill: Capillary refill takes less than 2 seconds.  Neurological:     Mental Status: She is alert.  Psychiatric:        Mood and Affect: Mood normal.      UC Treatments / Results  Labs (all labs ordered are listed, but only abnormal results are displayed) Labs Reviewed  POCT URINALYSIS DIP (MANUAL ENTRY) - Abnormal; Notable for the following components:      Result Value   Spec Grav, UA <=1.005 (*)    Leukocytes, UA Trace (*)    All other components within normal limits  URINE CULTURE    EKG   Radiology No results found.  Procedures Procedures (including critical Rivers time)  Medications Ordered in UC Medications - No data to display  Initial Impression / Assessment and Plan / UC Course  I have reviewed the triage vital signs and the nursing notes.  Pertinent labs & imaging results that were available during my Rivers of the patient were reviewed by me and considered in my medical decision making (see chart for details).     Urinary urgency - Plan: Urine Culture, Urine Culture  Lower urinary tract infectious disease   Symptoms, physical exam findings and urinalysis all suggest a urinary tract infection.  We will send the urine off for culture to ensure that antibiotic treatment will be adequate.  We will treat with the following: Macrobid  100 mg twice daily for 5 days. This is an antibiotic. If symptoms of yeast infection develop: Diflucan  150 mg take 1 tablet and then repeat in 3 days.  Make sure to stay hydrated by drinking plenty of water.  Return to urgent Rivers or PCP if symptoms worsen or  fail to resolve.    Final Clinical Impressions(s) / UC Diagnoses   Final diagnoses:  Urinary urgency  Lower urinary tract infectious disease     Discharge Instructions      Symptoms, physical exam findings and urinalysis all suggest a urinary tract infection.  We will send the urine off for culture to ensure that antibiotic treatment will be adequate.  We will treat with the following: Macrobid  100 mg twice daily for 5 days. This is an antibiotic. If symptoms of yeast infection develop: Diflucan  150 mg take 1 tablet and then repeat in 3 days.  Make sure to stay hydrated by drinking plenty of water.  Return to urgent Rivers or PCP if symptoms worsen or fail to resolve.       ED Prescriptions     Medication Sig Dispense Auth. Provider   nitrofurantoin , macrocrystal-monohydrate, (MACROBID ) 100 MG capsule Take 1 capsule (100 mg total) by mouth 2 (two) times daily. 10 capsule Teresa Norris A, PA-C   fluconazole  (DIFLUCAN ) 150 MG tablet Take 1 tablet (150 mg total) by mouth daily for 2 days. take 1 tablet and then repeat in 3 days 2 tablet Teresa Norris LABOR, PA-C      PDMP not reviewed this encounter.   Teresa Norris LABOR, [place] 11/29/23 1043

## 2023-11-29 NOTE — ED Triage Notes (Signed)
 Urinary urgency since this morning. No other symptoms. No fever. No otc meds.

## 2023-12-01 ENCOUNTER — Ambulatory Visit: Payer: Self-pay | Admitting: Internal Medicine

## 2023-12-01 LAB — URINE CULTURE

## 2023-12-02 ENCOUNTER — Telehealth (INDEPENDENT_AMBULATORY_CARE_PROVIDER_SITE_OTHER): Admitting: Family Medicine

## 2023-12-02 ENCOUNTER — Encounter: Payer: Self-pay | Admitting: Family Medicine

## 2023-12-02 VITALS — Ht 62.75 in

## 2023-12-02 DIAGNOSIS — E669 Obesity, unspecified: Secondary | ICD-10-CM | POA: Diagnosis not present

## 2023-12-02 IMAGING — DX DG CHEST 2V
2 series · 2 of 2 positions shown · non-contrast
Comparison: None Available.

CLINICAL DATA: cough

EXAM:
CHEST - 2 VIEW

[chest pa]
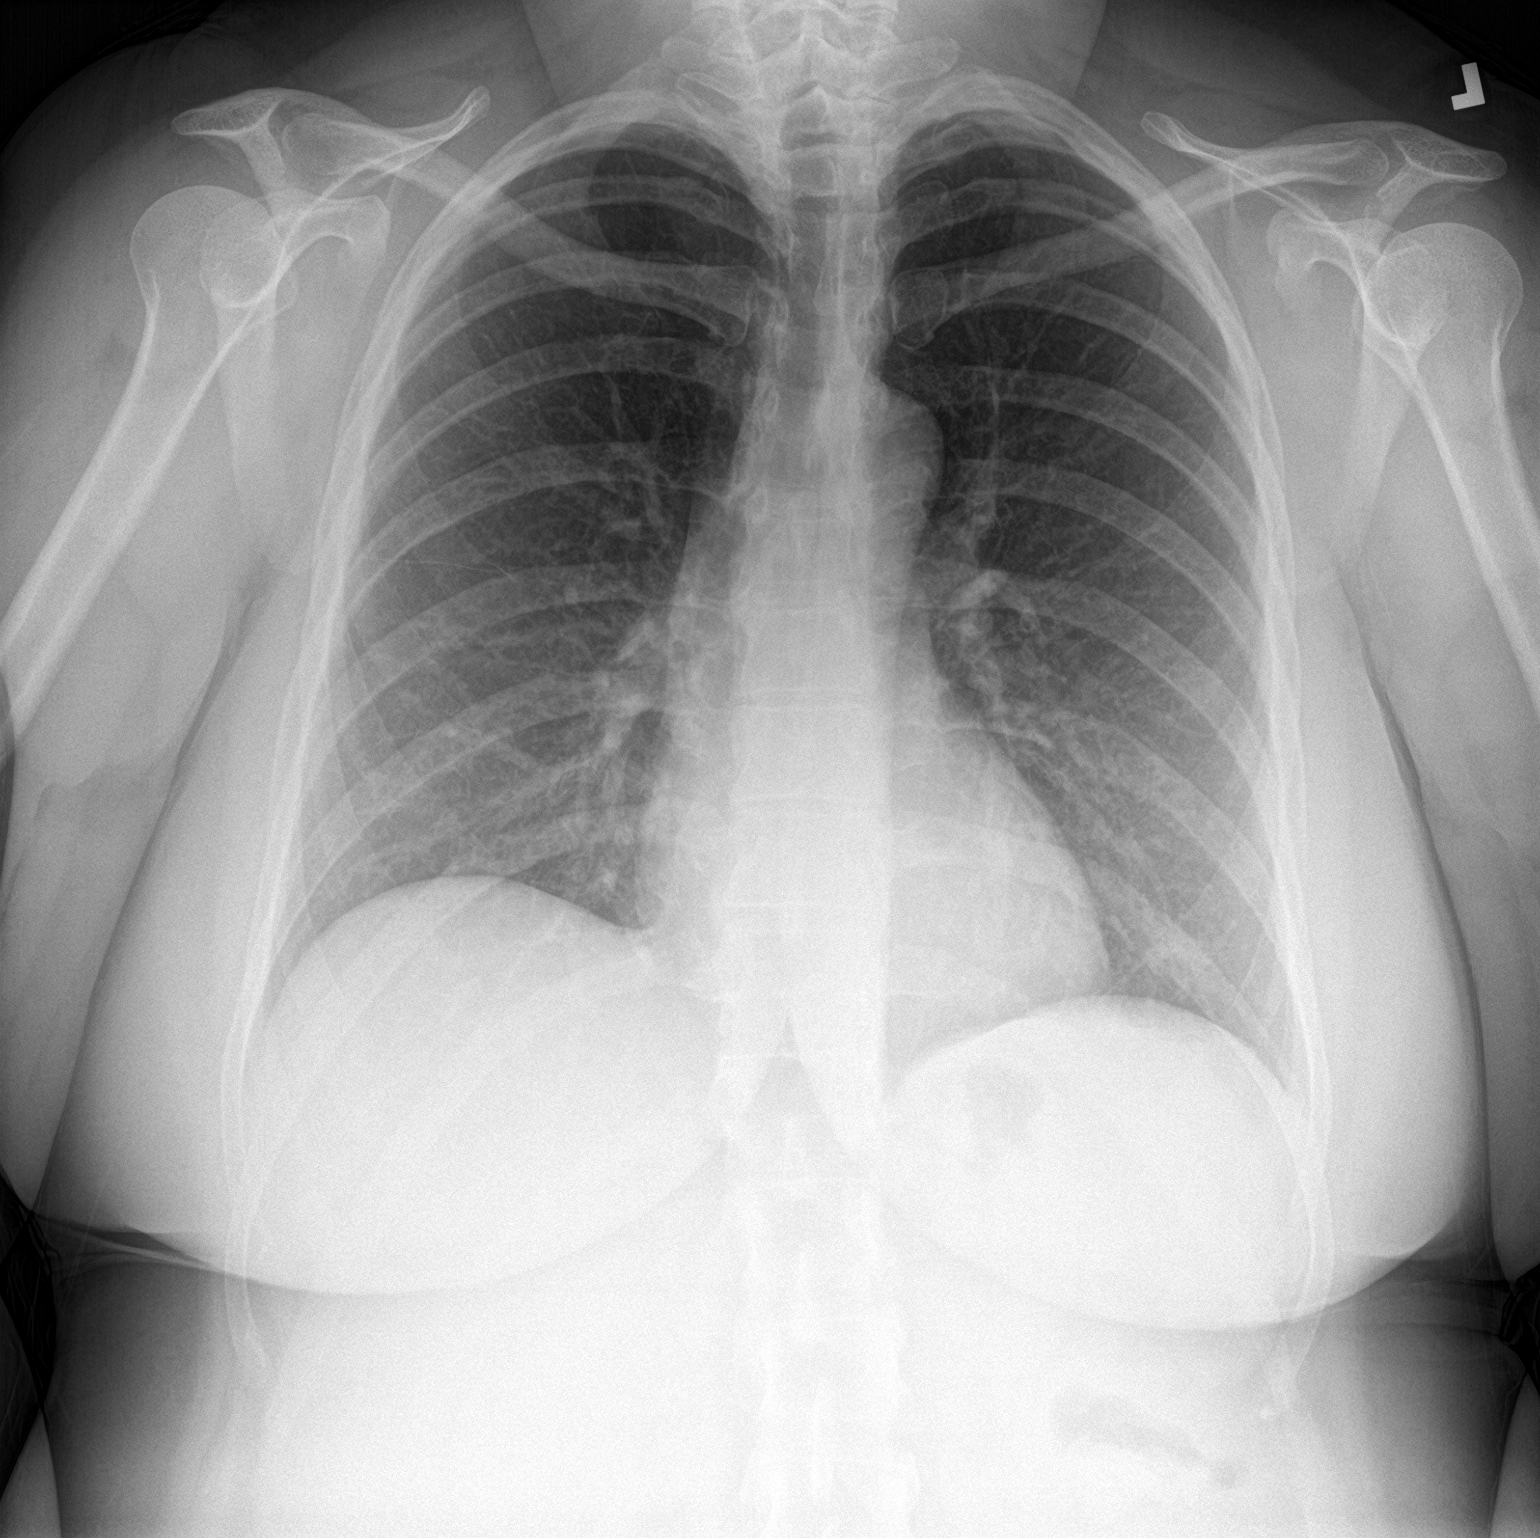

[chest lat]
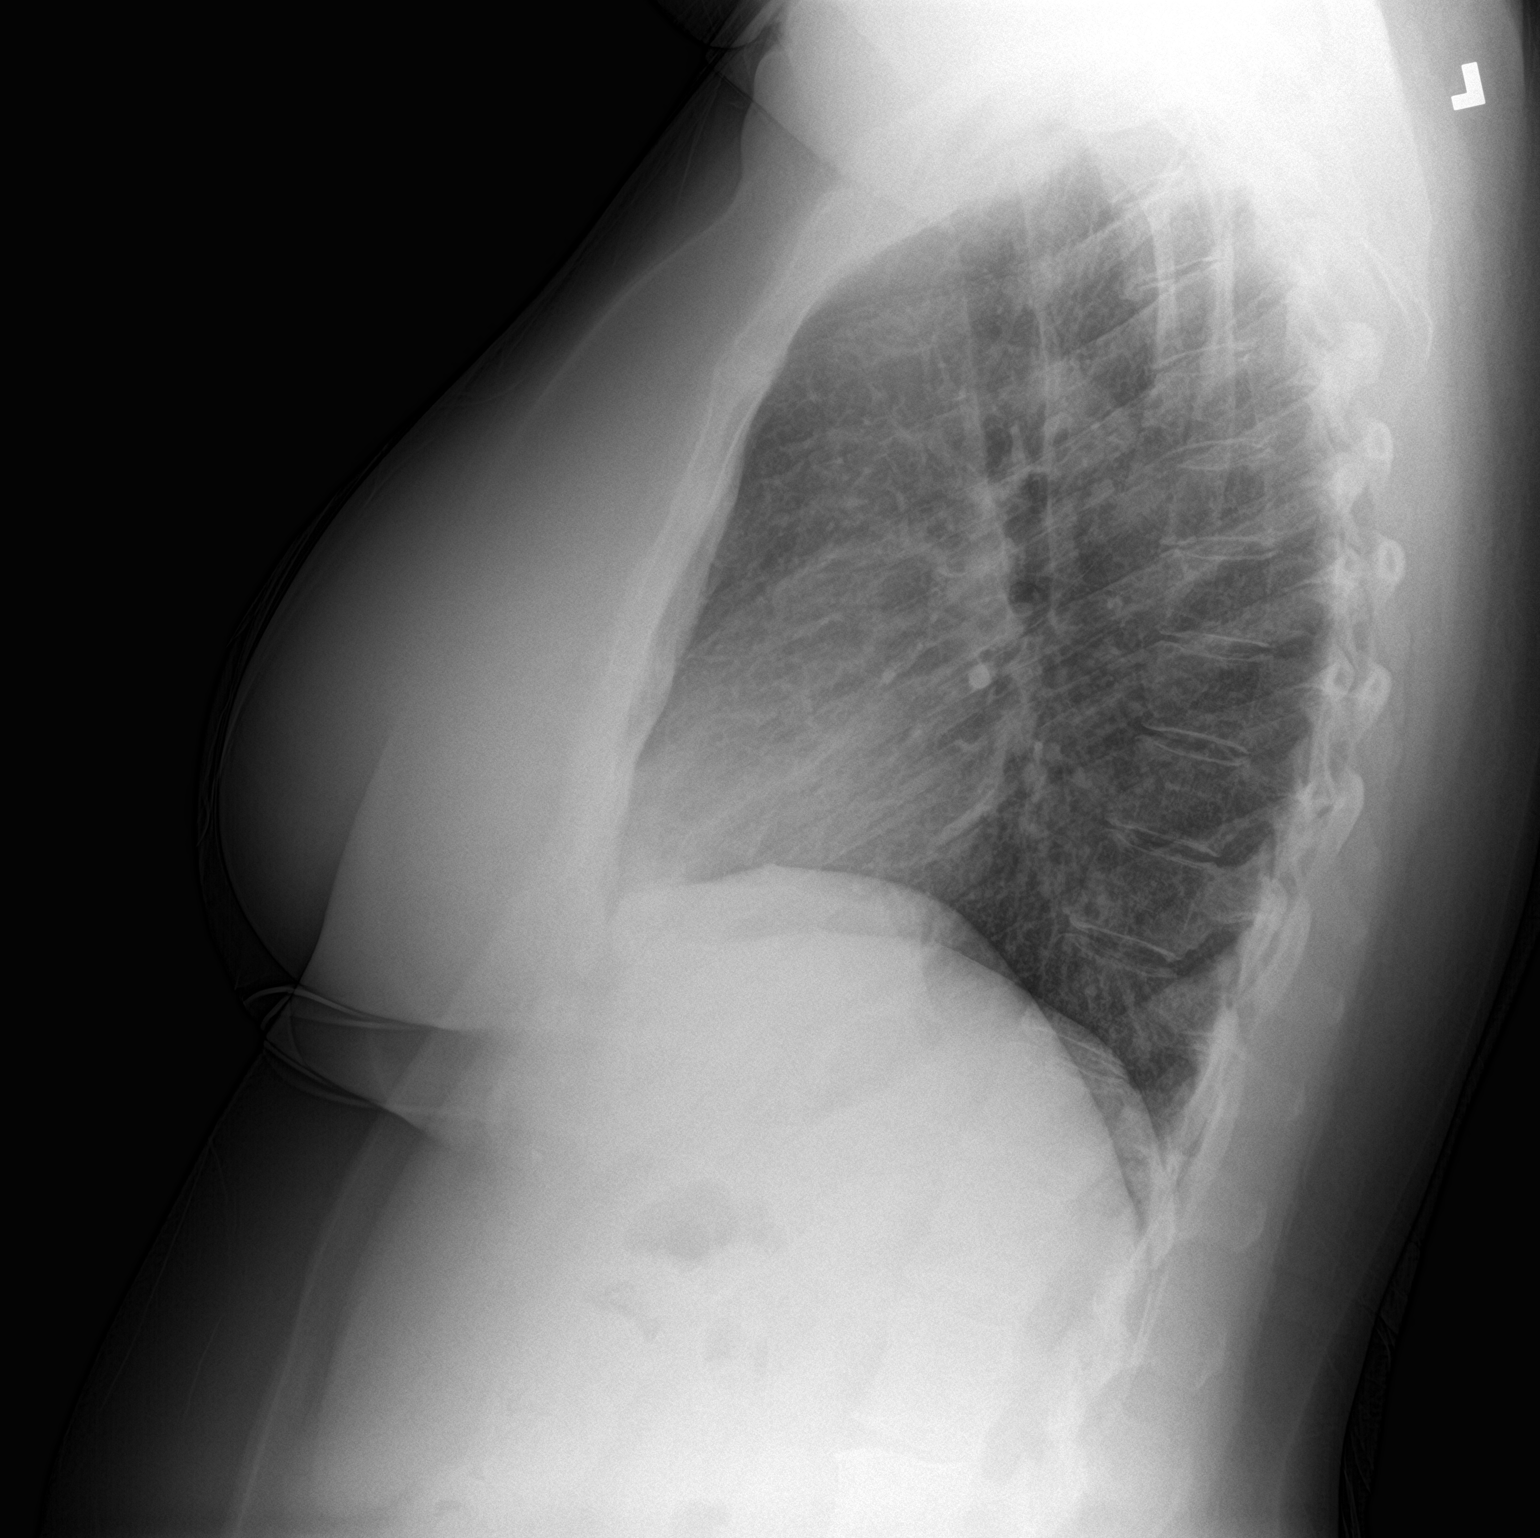

[2 of 2 positions shown; findings below may reference images not displayed]

FINDINGS: The heart size and mediastinal contours are within normal limits.
Both lungs are clear. The visualized skeletal structures are
unremarkable.
IMPRESSION: No active cardiopulmonary disease.

## 2023-12-02 MED ORDER — TIRZEPATIDE-WEIGHT MANAGEMENT 10 MG/0.5ML ~~LOC~~ SOLN: 10.0000 mg | SUBCUTANEOUS | 1 refills | Status: DC

## 2023-12-03 ENCOUNTER — Telehealth: Payer: Self-pay

## 2023-12-03 ENCOUNTER — Other Ambulatory Visit (HOSPITAL_COMMUNITY): Payer: Self-pay

## 2023-12-03 NOTE — Telephone Encounter (Signed)
 Pharmacy Patient Advocate Encounter   Received notification from Patient Pharmacy that prior authorization for Zepbound  10mg /0.67ml is required/requested.   Insurance verification completed.   The patient is insured through [employer] .   Per test claim: PA required; PA submitted to above mentioned insurance via Latent Key/confirmation #/EOC [id number] Status is pending

## 2023-12-04 ENCOUNTER — Encounter: Payer: Self-pay | Admitting: Physician Assistant

## 2023-12-04 ENCOUNTER — Ambulatory Visit (INDEPENDENT_AMBULATORY_CARE_PROVIDER_SITE_OTHER): Admitting: Physician Assistant

## 2023-12-04 ENCOUNTER — Ambulatory Visit: Payer: Self-pay

## 2023-12-04 ENCOUNTER — Other Ambulatory Visit (HOSPITAL_COMMUNITY): Payer: Self-pay

## 2023-12-04 VITALS — BP 121/74 | HR 81 | Ht 62.75 in | Wt 173.4 lb

## 2023-12-04 DIAGNOSIS — R3915 Urgency of urination: Secondary | ICD-10-CM | POA: Diagnosis not present

## 2023-12-04 LAB — POC URINALSYSI DIPSTICK (AUTOMATED)
Bilirubin, UA: NEGATIVE
Blood, UA: NEGATIVE
Glucose, UA: NEGATIVE
Ketones, UA: NEGATIVE
Leukocytes, UA: NEGATIVE
Nitrite, UA: NEGATIVE
Protein, UA: NEGATIVE
Spec Grav, UA: <=1.005 — AB (ref 1.010–1.025)
Urobilinogen, UA: 0.2 U/dL
pH, UA: 6 (ref 5.0–8.0)

## 2023-12-04 NOTE — Telephone Encounter (Signed)
 FYI Only or Action Required?: FYI only for provider.  Patient was last seen in primary care on 12/02/2023 by Copland, Monique BROCKS, MD.  Called Nurse Triage reporting Urinary Frequency.  Symptoms began a week ago.  Interventions attempted: Prescription medications: macrobid .  Symptoms are: unchanged.  Triage Disposition: See Physician Within 24 Hours  Patient/caregiver understands and will follow disposition?: Yes    Copied from CRM #[id number]. Topic: Clinical - Red Word Triage >> Dec 04, 2023  8:18 AM Monique Rivers wrote: [facility] that prompted transfer to Nurse Triage: experiencing urgency on uti still Reason for Disposition  Urinating more frequently than usual (i.e., frequency) OR new-onset of the feeling of an urgent need to urinate (i.e., urgency)  Answer Assessment - Initial Assessment Questions 1. SYMPTOM: What's the main symptom you're concerned about? (e.Rivers., frequency, incontinence)     frequency 2. ONSET: When did the  frequency  start?     Last sunday 3. PAIN: Is there any pain? If Yes, ask: How bad is it? (Scale: 1-10; mild, moderate, severe)     no 4. CAUSE: What do you think is causing the symptoms?     uti 5. OTHER SYMPTOMS: Do you have any other symptoms? (e.Rivers., blood in urine, fever, flank pain, pain with urination)     no  Protocols used: Urinary Symptoms-A-AH

## 2023-12-04 NOTE — Telephone Encounter (Signed)
 Pharmacy Patient Advocate Encounter  Received notification from [facility] that Prior Authorization for Zepbound  10mg /0.46ml has been APPROVED from 12/03/23 to 12/02/24   PA #/Case ID/Reference #: [id number]

## 2023-12-04 NOTE — Progress Notes (Signed)
 Established patient visit   Patient: Monique Rivers   DOB: 04/10/1966   58 y.o. Female  MRN: [id number] Visit Date: 12/04/2023  Today's healthcare provider: Manuelita Flatness, PA-C   Cc. Urinary urgency  Subjective     Pt was seen at urgent care 8/17 for urinary urgency, dysuria. She was prescribed macrobid  which she finished yesterday. Urine culture w/ multiple species present, recommend recollection.   She still feels some urgency but denies other urinary symptoms. Overall feels well.   Medications: Outpatient Medications Prior to Visit  Medication Sig   CALCIUM PO Take 1 tablet by mouth daily.   cetirizine (ZYRTEC) 10 MG tablet Take 10 mg by mouth daily.   fexofenadine  (ALLEGRA  ALLERGY) 180 MG tablet Take 1 tablet (180 mg total) by mouth daily for 15 days.   fluticasone  (FLONASE ) 50 MCG/ACT nasal spray Place 1 spray into both nostrils daily.   LORYNA 3-0.02 MG tablet Take 1 tablet by mouth daily.   Multiple Vitamin (MULTIVITAMIN WITH MINERALS) TABS tablet Take 1 tablet by mouth daily.   naphazoline (NAPHCON) 0.1 % ophthalmic solution Place 1 drop into both eyes daily as needed.   nitrofurantoin , macrocrystal-monohydrate, (MACROBID ) 100 MG capsule Take 1 capsule (100 mg total) by mouth 2 (two) times daily.   pantoprazole (PROTONIX) 40 MG tablet Take 40 mg by mouth daily.   Pseudoeph-Doxylamine-DM-APAP (DAYQUIL/NYQUIL COLD/FLU RELIEF PO) Take by mouth.   tirzepatide  10 MG/0.5ML injection vial Inject 10 mg into the skin once a week.   No facility-administered medications prior to visit.    Review of Systems  Constitutional:  Negative for fatigue and fever.  Respiratory:  Negative for cough and shortness of breath.   Cardiovascular:  Negative for chest pain and leg swelling.  Gastrointestinal:  Negative for abdominal pain.  Genitourinary:  Positive for urgency.  Neurological:  Negative for dizziness and headaches.       Objective    BP 121/74   Pulse 81   Ht 5'  2.75 (1.594 m)   Wt 173 lb 6.4 oz (78.7 kg)   LMP  (LMP Unknown)   BMI 30.96 kg/m    Physical Exam Vitals reviewed.  Constitutional:      Appearance: She is not ill-appearing.  HENT:     Head: Normocephalic.  Eyes:     Conjunctiva/sclera: Conjunctivae normal.  Cardiovascular:     Rate and Rhythm: Normal rate.  Pulmonary:     Effort: Pulmonary effort is normal. No respiratory distress.  Neurological:     Mental Status: She is alert and oriented to person, place, and time.  Psychiatric:        Mood and Affect: Mood normal.        Behavior: Behavior normal.     Results for orders placed or performed in visit on 12/04/23  POCT Urinalysis Dipstick (Automated)  Result Value Ref Range   Color, UA yellow    Clarity, UA clear    Glucose, UA Negative Negative   Bilirubin, UA neg    Ketones, UA neg    Spec Grav, UA <=1.005 (A) 1.010 - 1.025   Blood, UA neg    pH, UA 6.0 5.0 - 8.0   Protein, UA Negative Negative   Urobilinogen, UA 0.2 0.2 or 1.0 E.U./dL   Nitrite, UA neg    Leukocytes, UA Negative Negative    Assessment & Plan    Urinary urgency -     POCT Urinalysis Dipstick (Automated) -  Urine Culture   Repeat urine culture given persistent urgency and inconclusive first culture.  Will tx as indicated.   Return if symptoms worsen or fail to improve.       Manuelita Flatness, PA-C  [facility] [phone] (phone) [phone] (fax)  [facility]

## 2023-12-04 NOTE — Telephone Encounter (Signed)
 Pt has appt today

## 2023-12-05 LAB — URINE CULTURE
MICRO NUMBER:: 16870704
Result:: NO GROWTH
SPECIMEN QUALITY:: ADEQUATE

## 2023-12-07 ENCOUNTER — Ambulatory Visit: Payer: Self-pay | Admitting: Physician Assistant

## 2023-12-31 ENCOUNTER — Ambulatory Visit (INDEPENDENT_AMBULATORY_CARE_PROVIDER_SITE_OTHER)

## 2023-12-31 ENCOUNTER — Encounter: Payer: Self-pay | Admitting: Emergency Medicine

## 2023-12-31 ENCOUNTER — Ambulatory Visit
Admission: EM | Admit: 2023-12-31 | Discharge: 2023-12-31 | Disposition: A | Discharge status: Home / Self Care | Attending: Family Medicine | Admitting: Family Medicine

## 2023-12-31 DIAGNOSIS — M542 Cervicalgia: Secondary | ICD-10-CM | POA: Diagnosis not present

## 2023-12-31 DIAGNOSIS — M5030 Other cervical disc degeneration, unspecified cervical region: Secondary | ICD-10-CM

## 2023-12-31 MED ORDER — METHYLPREDNISOLONE 4 MG PO TBPK: ORAL_TABLET | ORAL | 0 refills | Status: AC

## 2023-12-31 MED ORDER — KETOROLAC TROMETHAMINE 30 MG/ML IJ SOLN
30.0000 mg | Freq: Once | INTRAMUSCULAR | Status: AC
  Administered 2023-12-31: 30 mg via INTRAMUSCULAR

## 2023-12-31 MED ORDER — TIZANIDINE HCL 4 MG PO TABS: 4.0000 mg | ORAL_TABLET | Freq: Four times a day (QID) | ORAL | 0 refills | Status: AC | PRN

## 2023-12-31 NOTE — ED Provider Notes (Addendum)
 [facility]    CSN: [id number] Arrival date & time: 12/31/23  1007      History   Chief Complaint Chief Complaint  Patient presents with   Shoulder Pain    HPI Monique Rivers is a 58 y.o. female.   Patient has had the onset of left shoulder pain for no accident injury or reason that she can think of.  It hurts to turn her shoulder.  Hurts to shrug her shoulder.  Hurts on the left side only.  She states that heat makes it feel little bit better.  She states the pain is severe.  Pain radiates all the way down to her deltoid reason with a vague sensation that her left hand feels funny.  Never had any neck or shoulder problems.  Went to the chiropractor and this did not help.  Took 800 mg of ibuprofen this morning and this did not help.    Past Medical History:  Diagnosis Date   GERD (gastroesophageal reflux disease)     Patient Active Problem List   Diagnosis Date Noted   Bronchitis 02/10/2022   Prediabetes 02/14/2019   Acute appendicitis 02/13/2016    Past Surgical History:  Procedure Laterality Date   LAPAROSCOPIC APPENDECTOMY N/A 02/14/2016   Procedure: APPENDECTOMY LAPAROSCOPIC;  Surgeon: Krystal Russell, MD;  Location: [facility];  Service: General;  Laterality: N/A;    OB History   No obstetric history on file.      Home Medications    Prior to Admission medications   Medication Sig Start Date End Date Taking? Authorizing Provider  CALCIUM PO Take 1 tablet by mouth daily.   Yes [provider]  cetirizine (ZYRTEC) 10 MG tablet Take 10 mg by mouth daily.   Yes [provider]  fluticasone  (FLONASE ) 50 MCG/ACT nasal spray Place 1 spray into both nostrils daily.   Yes [provider]  LORYNA 3-0.02 MG tablet Take 1 tablet by mouth daily. 01/23/16  Yes [provider]  methylPREDNISolone  (MEDROL  DOSEPAK) 4 MG TBPK tablet tad 12/31/23  Yes Maranda Jamee Jacob, MD  Multiple Vitamin (MULTIVITAMIN WITH MINERALS) TABS tablet  Take 1 tablet by mouth daily.   Yes [provider]  naphazoline (NAPHCON) 0.1 % ophthalmic solution Place 1 drop into both eyes daily as needed.   Yes [provider]  pantoprazole (PROTONIX) 40 MG tablet Take 40 mg by mouth daily. 11/28/15  Yes [provider]  tirzepatide  10 MG/0.5ML injection vial Inject 10 mg into the skin once a week. 12/02/23  Yes Copland, Harlene BROCKS, MD  tiZANidine  (ZANAFLEX ) 4 MG tablet Take 1-2 tablets (4-8 mg total) by mouth every 6 (six) hours as needed for muscle spasms. 12/31/23  Yes Maranda Jamee Jacob, MD    Family History Family History  Problem Relation Age of Onset   Diabetes Mother    Hearing loss Mother    Early death Mother    Cancer Father    Hypertension Father    Hyperlipidemia Father    Stroke Father    Miscarriages / [place] Sister    Hearing loss Maternal Grandmother    Hyperlipidemia Maternal Grandmother    Hypertension Maternal Grandmother    Heart disease Maternal Grandmother    Stroke Maternal Grandmother    Heart attack Maternal Grandfather    Early death Maternal Grandfather    Early death Paternal Grandmother    Cancer Paternal Grandmother    Early death Paternal Grandfather    Heart attack Paternal Grandfather  Early death Paternal Aunt    Leukemia Paternal Aunt     Social History Social History   Tobacco Use   Smoking status: Never   Smokeless tobacco: Never  Vaping Use   Vaping status: Never Used  Substance Use Topics   Alcohol use: Yes    Comment: rarely   Drug use: Never     Allergies   Amoxicillin, Hydrocodone -acetaminophen , Penicillins, and Kenac  [triamcinolone]   Review of Systems Review of Systems See HPI  Physical Exam Triage Vital Signs ED Triage Vitals  Encounter Vitals Group     BP 12/31/23 1022 129/84     Girls Systolic BP Percentile --      Girls Diastolic BP Percentile --      Boys Systolic BP Percentile --      Boys Diastolic BP Percentile --      Pulse  Rate 12/31/23 1022 82     Resp 12/31/23 1022 18     Temp 12/31/23 1022 98.7 F (37.1 C)     Temp Source 12/31/23 1022 Oral     SpO2 12/31/23 1022 97 %     Weight 12/31/23 1023 168 lb (76.2 kg)     Height 12/31/23 1023 5' 2.75 (1.594 m)     Head Circumference --      Peak Flow --      Pain Score 12/31/23 1023 6     Pain Loc --      Pain Education --      Exclude from Growth Chart --    No data found.  Updated Vital Signs BP 129/84 (BP Location: Right Arm)   Pulse 82   Temp 98.7 F (37.1 C) (Oral)   Resp 18   Ht 5' 2.75 (1.594 m)   Wt 76.2 kg   SpO2 97%   BMI 30.00 kg/m       Physical Exam Constitutional:      General: She is in acute distress.     Appearance: She is well-developed.     Comments: Acutely uncomfortable.  Stiff guarded movements  HENT:     Head: Normocephalic and atraumatic.  Eyes:     Conjunctiva/sclera: Conjunctivae normal.     Pupils: Pupils are equal, round, and reactive to light.  Neck:     Comments: Tenderness in the left upper body of the trapezius from the occiput down the border of the medial scapula.  Pain with neck range of motion secondary to this muscular spasm Cardiovascular:     Rate and Rhythm: Normal rate.  Pulmonary:     Effort: Pulmonary effort is normal. No respiratory distress.  Abdominal:     General: There is no distension.     Palpations: Abdomen is soft.  Musculoskeletal:        General: Normal range of motion.     Cervical back: Normal range of motion. Tenderness present.  Skin:    General: Skin is warm and dry.  Neurological:     General: No focal deficit present.     Mental Status: She is alert.      UC Treatments / Results  Labs (all labs ordered are listed, but only abnormal results are displayed) Labs Reviewed - No data to display  EKG   Radiology DG Cervical Spine Complete Result Date: 12/31/2023 CLINICAL DATA:  Acute left neck pain for 2 days without known injury. EXAM: CERVICAL SPINE - COMPLETE 4+  VIEW COMPARISON:  None Available. FINDINGS: Minimal retrolisthesis of C5-6 is noted secondary  to moderate degenerative disc disease at this level. Minimal degenerative disc disease is noted at C6-7. No fracture is noted. No significant neural foraminal stenosis is noted. IMPRESSION: Multilevel degenerative disc disease as described above. No acute abnormality seen. Electronically Signed   By: Lynwood Landy Raddle M.D.   On: 12/31/2023 11:48    Procedures Procedures (including critical care time)  Medications Ordered in UC Medications  ketorolac  (TORADOL ) 30 MG/ML injection 30 mg (30 mg Intramuscular Given 12/31/23 1038)    Initial Impression / Assessment and Plan / UC Course  I have reviewed the triage vital signs and the nursing notes.  Pertinent labs & imaging results that were available during my care of the patient were reviewed by me and considered in my medical decision making (see chart for details).     I showed patient her x-rays and discussed them with her.  She has multilevel degenerative disc disease.  This could contribute to the onset of neck pain with radiation into the left arm.  Will treat with steroids and muscle relaxer.  Offered pain medicine which patient declines.  Follow-up with PCP. Final Clinical Impressions(s) / UC Diagnoses   Final diagnoses:  Neck pain on left side  Musculoskeletal neck pain  Degenerative disc disease, cervical     Discharge Instructions      Take the Medrol  as directed.  This is a steroid anti-inflammatory.  Take all of day 1 today as a single dose, when you get prescription filled May take the muscle relaxer up to 3 times a day.  It can cause drowsiness.  Today, I recommend you go home and take the muscle relaxer, with a second dose at bedtime.  Tomorrow you can start taking it once a day only at bedtime Home for the rest of the day with ice or heat on neck.  Try to get some rest See your doctor if not improving by next week     ED  Prescriptions     Medication Sig Dispense Auth. Provider   methylPREDNISolone  (MEDROL  DOSEPAK) 4 MG TBPK tablet tad 21 tablet Maranda Jamee Jacob, MD   tiZANidine  (ZANAFLEX ) 4 MG tablet Take 1-2 tablets (4-8 mg total) by mouth every 6 (six) hours as needed for muscle spasms. 21 tablet Maranda Jamee Jacob, MD      PDMP not reviewed this encounter.   Maranda Jamee Jacob, MD 12/31/23 1204    Maranda Jamee Jacob, MD 12/31/23 [phone]

## 2023-12-31 NOTE — ED Triage Notes (Signed)
 Patient c/o left shoulder/back pain x 2 days, pain wraps around to the front.  No apparent injury.  Patient is currently training for a half marathon so she did run/walk 3 miles on Tuesday.  Some pain in the front of left shoulder.  Patient has taken Advil this am.

## 2023-12-31 NOTE — Discharge Instructions (Signed)
 Take the Medrol  as directed.  This is a steroid anti-inflammatory.  Take all of day 1 today as a single dose, when you get prescription filled May take the muscle relaxer up to 3 times a day.  It can cause drowsiness.  Today, I recommend you go home and take the muscle relaxer, with a second dose at bedtime.  Tomorrow you can start taking it once a day only at bedtime Home for the rest of the day with ice or heat on neck.  Try to get some rest See your doctor if not improving by next week

## 2024-01-28 LAB — HM MAMMOGRAPHY

## 2024-02-24 ENCOUNTER — Encounter: Payer: Self-pay | Admitting: Family Medicine

## 2024-02-24 MED ORDER — ZEPBOUND 12.5 MG/0.5ML ~~LOC~~ SOAJ: 12.5000 mg | SUBCUTANEOUS | 1 refills | Status: DC

## 2024-02-24 NOTE — Addendum Note (Signed)
 Addended by: WATT RAISIN C on: 02/24/2024 04:28 PM   Modules accepted: Orders

## 2024-03-20 NOTE — Patient Instructions (Incomplete)
 It was good to see you today, I will be in touch with your labs

## 2024-03-20 NOTE — Progress Notes (Unsigned)
 [facility] at [facility] [address] [place], [place] [address] [phone]  Date:  03/23/2024   Name:  Monique Rivers   DOB:  10-Jul-1965   MRN:  [id number]  PCP:  Watt Harlene BROCKS, MD    Chief Complaint: No chief complaint on file.   History of Present Illness:  Monique Rivers is a 58 y.o. very pleasant female patient who presents with the following:  Patient seen today for physical exam.  I saw her most recently for a virtual visit in August, in person back in March She has history of obesity, prediabetes.  She has been using Zepbound  for weight loss with success  Mammogram Flu shot Can offer pneumococcal vaccination Colon cancer screening up-to-date Can offer Pap smear at the I think she does have a gynecologist She has completed Shingrix   Due for lab update  Discussed the use of AI scribe software for clinical note transcription with the patient, who gave verbal consent to proceed.  History of Present Illness    Patient Active Problem List   Diagnosis Date Noted   Bronchitis 02/10/2022   Prediabetes 02/14/2019   Acute appendicitis 02/13/2016    Past Medical History:  Diagnosis Date   GERD (gastroesophageal reflux disease)     Past Surgical History:  Procedure Laterality Date   LAPAROSCOPIC APPENDECTOMY N/A 02/14/2016   Procedure: APPENDECTOMY LAPAROSCOPIC;  Surgeon: Krystal Russell, MD;  Location: [facility];  Service: General;  Laterality: N/A;    Social History   Tobacco Use   Smoking status: Never   Smokeless tobacco: Never  Vaping Use   Vaping status: Never Used  Substance Use Topics   Alcohol use: Yes    Comment: rarely   Drug use: Never    Family History  Problem Relation Age of Onset   Diabetes Mother    Hearing loss Mother    Early death Mother    Cancer Father    Hypertension Father    Hyperlipidemia Father    Stroke Father    Miscarriages / Stillbirths Sister    Hearing loss  Maternal Grandmother    Hyperlipidemia Maternal Grandmother    Hypertension Maternal Grandmother    Heart disease Maternal Grandmother    Stroke Maternal Grandmother    Heart attack Maternal Grandfather    Early death Maternal Grandfather    Early death Paternal Grandmother    Cancer Paternal Grandmother    Early death Paternal Grandfather    Heart attack Paternal Grandfather    Early death Paternal Aunt    Leukemia Paternal Aunt     Allergies  Allergen Reactions   Amoxicillin Nausea And Vomiting    Has patient had a PCN reaction causing immediate rash, facial/tongue/throat swelling, SOB or lightheadedness with hypotension: No Has patient had a PCN reaction causing severe rash involving mucus membranes or skin necrosis: No Has patient had a PCN reaction that required hospitalization: No Has patient had a PCN reaction occurring within the last 10 years: No If all of the above answers are NO, then may proceed with Cephalosporin use.    Hydrocodone -Acetaminophen  Other (See Comments)    made me crazy   Penicillins    Kenac  [Triamcinolone] Rash    Medication list has been reviewed and updated.  Current Outpatient Medications on File Prior to Visit  Medication Sig Dispense Refill   CALCIUM PO Take 1 tablet by mouth daily.     cetirizine (ZYRTEC) 10 MG tablet  Take 10 mg by mouth daily.     fluticasone  (FLONASE ) 50 MCG/ACT nasal spray Place 1 spray into both nostrils daily.     LORYNA 3-0.02 MG tablet Take 1 tablet by mouth daily.     methylPREDNISolone  (MEDROL  DOSEPAK) 4 MG TBPK tablet tad 21 tablet 0   Multiple Vitamin (MULTIVITAMIN WITH MINERALS) TABS tablet Take 1 tablet by mouth daily.     naphazoline (NAPHCON) 0.1 % ophthalmic solution Place 1 drop into both eyes daily as needed.     pantoprazole (PROTONIX) 40 MG tablet Take 40 mg by mouth daily.     tirzepatide  (ZEPBOUND ) 12.5 MG/0.5ML Pen Inject 12.5 mg into the skin once a week. 6 mL 1   tirzepatide  10 MG/0.5ML  injection vial Inject 10 mg into the skin once a week. 6 mL 1   tiZANidine  (ZANAFLEX ) 4 MG tablet Take 1-2 tablets (4-8 mg total) by mouth every 6 (six) hours as needed for muscle spasms. 21 tablet 0   No current facility-administered medications on file prior to visit.    Review of Systems:  As per HPI- otherwise negative.   Physical Examination: There were no vitals filed for this visit. There were no vitals filed for this visit. There is no height or weight on file to calculate BMI. Ideal Body Weight:    GEN: no acute distress. HEENT: Atraumatic, Normocephalic.  Ears and Nose: No external deformity. CV: RRR, No M/G/R. No JVD. No thrill. No extra heart sounds. PULM: CTA B, no wheezes, crackles, rhonchi. No retractions. No resp. distress. No accessory muscle use. ABD: S, NT, ND, +BS. No rebound. No HSM. EXTR: No c/c/e PSYCH: Normally interactive. Conversant.    Assessment and Plan: No diagnosis found.  Assessment & Plan   Signed Harlene Schroeder, MD

## 2024-03-23 ENCOUNTER — Encounter: Payer: Self-pay | Admitting: Family Medicine

## 2024-03-23 ENCOUNTER — Ambulatory Visit: Payer: BC Managed Care – PPO | Admitting: Family Medicine

## 2024-03-23 VITALS — BP 126/84 | HR 87 | Temp 98.0°F | Resp 16 | Ht 62.75 in | Wt 167.0 lb

## 2024-03-23 DIAGNOSIS — Z130 Encounter for screening for diseases of the blood and blood-forming organs and certain disorders involving the immune mechanism: Secondary | ICD-10-CM

## 2024-03-23 DIAGNOSIS — R7303 Prediabetes: Secondary | ICD-10-CM | POA: Diagnosis not present

## 2024-03-23 DIAGNOSIS — Z1329 Encounter for screening for other suspected endocrine disorder: Secondary | ICD-10-CM

## 2024-03-23 DIAGNOSIS — E669 Obesity, unspecified: Secondary | ICD-10-CM

## 2024-03-23 DIAGNOSIS — Z13220 Encounter for screening for lipoid disorders: Secondary | ICD-10-CM

## 2024-03-23 DIAGNOSIS — Z0000 Encounter for general adult medical examination without abnormal findings: Secondary | ICD-10-CM

## 2024-03-23 LAB — HEMOGLOBIN A1C: Hgb A1c MFr Bld: 5 % (ref 4.6–6.5)

## 2024-03-23 LAB — COMPREHENSIVE METABOLIC PANEL WITH GFR
ALT: 19 U/L (ref 0–35)
AST: 20 U/L (ref 0–37)
Albumin: 4.1 g/dL (ref 3.5–5.2)
Alkaline Phosphatase: 83 U/L (ref 39–117)
BUN: 12 mg/dL (ref 6–23)
CO2: 29 meq/L (ref 19–32)
Calcium: 9.7 mg/dL (ref 8.4–10.5)
Chloride: 104 meq/L (ref 96–112)
Creatinine, Ser: 0.73 mg/dL (ref 0.40–1.20)
GFR: 92.16 mL/min (ref >60.00)
Glucose, Bld: 82 mg/dL (ref 70–99)
Potassium: 4.5 meq/L (ref 3.5–5.1)
Sodium: 138 meq/L (ref 135–145)
Total Bilirubin: 0.3 mg/dL (ref 0.2–1.2)
Total Protein: 6.3 g/dL (ref 6.0–8.3)

## 2024-03-23 LAB — LIPID PANEL
Cholesterol: 221 mg/dL — ABNORMAL HIGH (ref 0–200)
HDL: 69.5 mg/dL (ref >39.00)
LDL Cholesterol: 131 mg/dL — ABNORMAL HIGH (ref 0–99)
NonHDL: 151.43
Total CHOL/HDL Ratio: 3
Triglycerides: 103 mg/dL (ref 0.0–149.0)
VLDL: 20.6 mg/dL (ref 0.0–40.0)

## 2024-03-23 LAB — CBC
HCT: 42 % (ref 36.0–46.0)
Hemoglobin: 14 g/dL (ref 12.0–15.0)
MCHC: 33.3 g/dL (ref 30.0–36.0)
MCV: 87.2 fl (ref 78.0–100.0)
Platelets: 302 K/uL (ref 150.0–400.0)
RBC: 4.81 Mil/uL (ref 3.87–5.11)
RDW: 13.3 % (ref 11.5–15.5)
WBC: 7.5 K/uL (ref 4.0–10.5)

## 2024-03-23 LAB — TSH: TSH: 1.43 u[IU]/mL (ref 0.35–5.50)

## 2024-03-23 MED ORDER — ZEPBOUND 15 MG/0.5ML ~~LOC~~ SOAJ: 15.0000 mg | SUBCUTANEOUS | 2 refills | Status: AC

## 2024-07-14 ENCOUNTER — Ambulatory Visit: Admitting: Family Medicine

## 2025-03-27 ENCOUNTER — Encounter: Admitting: Family Medicine
